# Patient Record
Sex: Female | Born: 1955
Health system: Southern US, Community
[De-identification: ages and names within clinical notes are randomized; demographics above are authoritative.]

## PROBLEM LIST (undated history)

## (undated) DIAGNOSIS — Z9889 Other specified postprocedural states: Secondary | ICD-10-CM

## (undated) DIAGNOSIS — G4730 Sleep apnea, unspecified: Secondary | ICD-10-CM

## (undated) DIAGNOSIS — F419 Anxiety disorder, unspecified: Secondary | ICD-10-CM

## (undated) DIAGNOSIS — D649 Anemia, unspecified: Secondary | ICD-10-CM

## (undated) DIAGNOSIS — N815 Vaginal enterocele: Secondary | ICD-10-CM

## (undated) DIAGNOSIS — R21 Rash and other nonspecific skin eruption: Secondary | ICD-10-CM

## (undated) DIAGNOSIS — Z8742 Personal history of other diseases of the female genital tract: Secondary | ICD-10-CM

## (undated) DIAGNOSIS — I519 Heart disease, unspecified: Secondary | ICD-10-CM

## (undated) DIAGNOSIS — F32A Depression, unspecified: Secondary | ICD-10-CM

## (undated) DIAGNOSIS — F329 Major depressive disorder, single episode, unspecified: Secondary | ICD-10-CM

## (undated) DIAGNOSIS — E785 Hyperlipidemia, unspecified: Secondary | ICD-10-CM

## (undated) HISTORY — DX: Sleep apnea, unspecified: G47.30

## (undated) HISTORY — PX: CARPAL TUNNEL RELEASE: SHX101

## (undated) HISTORY — DX: Hyperlipidemia, unspecified: E78.5

## (undated) HISTORY — PX: CORONARY ANGIOPLASTY WITH STENT PLACEMENT: SHX49

## (undated) HISTORY — DX: Rash and other nonspecific skin eruption: R21

## (undated) HISTORY — DX: Heart disease, unspecified: I51.9

## (undated) HISTORY — DX: Vaginal enterocele: N81.5

## (undated) HISTORY — DX: Personal history of other diseases of the female genital tract: Z87.42

## (undated) HISTORY — DX: Major depressive disorder, single episode, unspecified: F32.9

## (undated) HISTORY — DX: Anemia, unspecified: D64.9

## (undated) HISTORY — DX: Anxiety disorder, unspecified: F41.9

## (undated) HISTORY — DX: Depression, unspecified: F32.A

---

## 2006-07-05 ENCOUNTER — Emergency Department: Payer: Self-pay | Admitting: Emergency Medicine

## 2010-04-24 ENCOUNTER — Ambulatory Visit: Payer: Self-pay | Admitting: Internal Medicine

## 2010-06-17 ENCOUNTER — Ambulatory Visit: Payer: Self-pay | Admitting: Internal Medicine

## 2011-09-10 ENCOUNTER — Ambulatory Visit: Payer: Self-pay | Admitting: Internal Medicine

## 2011-09-13 ENCOUNTER — Inpatient Hospital Stay: Payer: Self-pay | Admitting: Specialist

## 2013-05-29 HISTORY — PX: ANGIOPLASTY: SHX39

## 2013-05-30 ENCOUNTER — Ambulatory Visit: Payer: Self-pay | Admitting: Internal Medicine

## 2013-06-22 ENCOUNTER — Ambulatory Visit: Payer: Self-pay | Admitting: Vascular Surgery

## 2013-07-26 ENCOUNTER — Ambulatory Visit: Payer: Self-pay | Admitting: Vascular Surgery

## 2013-07-26 LAB — BASIC METABOLIC PANEL
BUN: 9 mg/dL (ref 7–18)
Creatinine: 0.83 mg/dL (ref 0.60–1.30)
EGFR (African American): >60
Glucose: 75 mg/dL (ref 65–99)
Osmolality: 273 (ref 275–301)

## 2013-07-26 LAB — CBC
MCH: 31 pg (ref 26.0–34.0)
MCHC: 34 g/dL (ref 32.0–36.0)
WBC: 6.3 10*3/uL (ref 3.6–11.0)

## 2013-08-03 ENCOUNTER — Inpatient Hospital Stay: Payer: Self-pay | Admitting: Vascular Surgery

## 2013-08-04 LAB — CBC WITH DIFFERENTIAL/PLATELET
Basophil #: 0.1 10*3/uL (ref 0.0–0.1)
Basophil %: 0.5 %
Eosinophil #: 0.1 10*3/uL (ref 0.0–0.7)
Eosinophil %: 0.5 %
HGB: 11.5 g/dL — ABNORMAL LOW (ref 12.0–16.0)
MCH: 31 pg (ref 26.0–34.0)
MCHC: 33.9 g/dL (ref 32.0–36.0)
MCV: 92 fL (ref 80–100)
Monocyte #: 0.9 x10 3/mm (ref 0.2–0.9)
Monocyte %: 7.6 %
Neutrophil #: 8.4 10*3/uL — ABNORMAL HIGH (ref 1.4–6.5)
WBC: 11.8 10*3/uL — ABNORMAL HIGH (ref 3.6–11.0)

## 2013-08-04 LAB — BASIC METABOLIC PANEL
Anion Gap: 5 — ABNORMAL LOW (ref 7–16)
Calcium, Total: 8.8 mg/dL (ref 8.5–10.1)
Chloride: 107 mmol/L (ref 98–107)
Creatinine: 0.64 mg/dL (ref 0.60–1.30)
EGFR (African American): >60
EGFR (Non-African Amer.): >60
Osmolality: 276 (ref 275–301)
Potassium: 3.9 mmol/L (ref 3.5–5.1)
Sodium: 139 mmol/L (ref 136–145)

## 2013-08-04 LAB — PROTIME-INR: Prothrombin Time: 12.8 secs (ref 11.5–14.7)

## 2013-08-04 LAB — APTT: Activated PTT: 28.7 secs (ref 23.6–35.9)

## 2013-08-06 LAB — PATHOLOGY REPORT

## 2013-10-19 ENCOUNTER — Ambulatory Visit: Payer: Self-pay | Admitting: Internal Medicine

## 2014-03-26 ENCOUNTER — Ambulatory Visit: Payer: Self-pay | Admitting: Obstetrics and Gynecology

## 2014-03-26 DIAGNOSIS — I739 Peripheral vascular disease, unspecified: Secondary | ICD-10-CM

## 2014-03-26 DIAGNOSIS — I2510 Atherosclerotic heart disease of native coronary artery without angina pectoris: Secondary | ICD-10-CM

## 2014-03-29 DIAGNOSIS — Z8742 Personal history of other diseases of the female genital tract: Secondary | ICD-10-CM

## 2014-03-29 HISTORY — DX: Personal history of other diseases of the female genital tract: Z87.42

## 2014-03-29 HISTORY — PX: VAGINAL HYSTERECTOMY: SUR661

## 2014-04-01 ENCOUNTER — Ambulatory Visit: Payer: Self-pay | Admitting: Obstetrics and Gynecology

## 2014-04-02 LAB — HEMOGLOBIN: HGB: 10.6 g/dL — ABNORMAL LOW (ref 12.0–16.0)

## 2014-04-02 LAB — PATHOLOGY REPORT

## 2014-06-04 ENCOUNTER — Emergency Department: Payer: Self-pay | Admitting: Emergency Medicine

## 2014-11-22 IMAGING — US US CAROTID DUPLEX BILAT
2 series · 13 of 24 positions shown · non-contrast
Comparison: none

REASON FOR EXAM: Lt Carotid Bruit
COMMENTS:

[Series 1: us carotid duplex bilat · 0.05mm/px · 10 of 64 slices shown (1 of 2)]
[im 1/64]
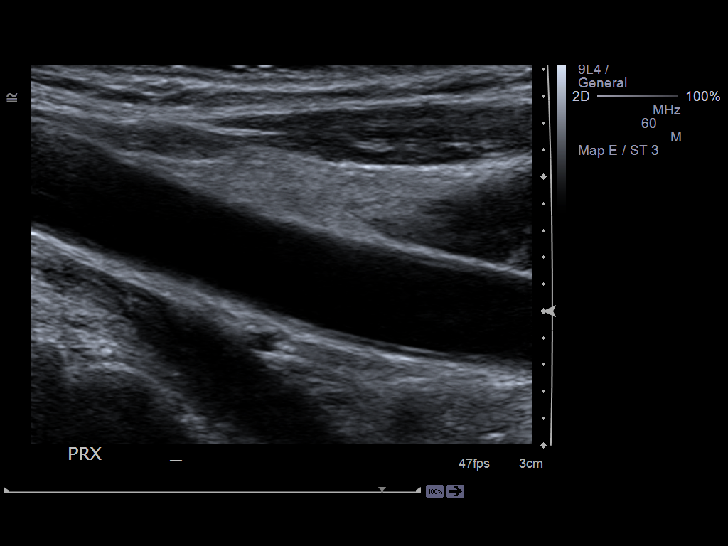
[im 8/64]
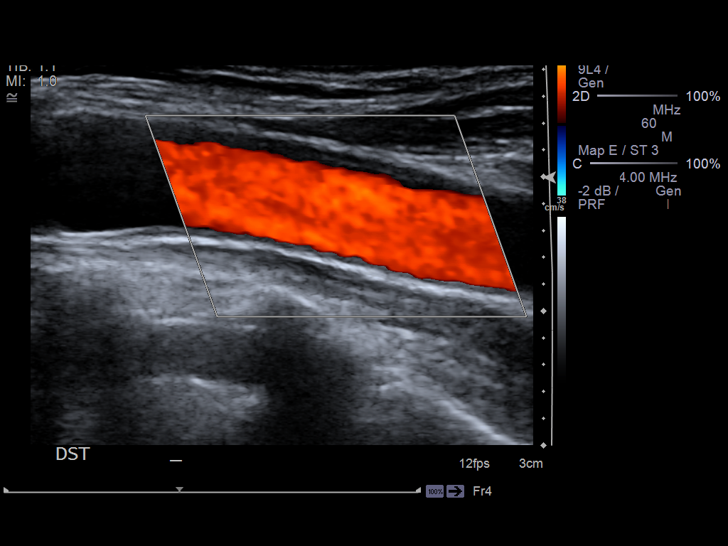
[im 15/64]
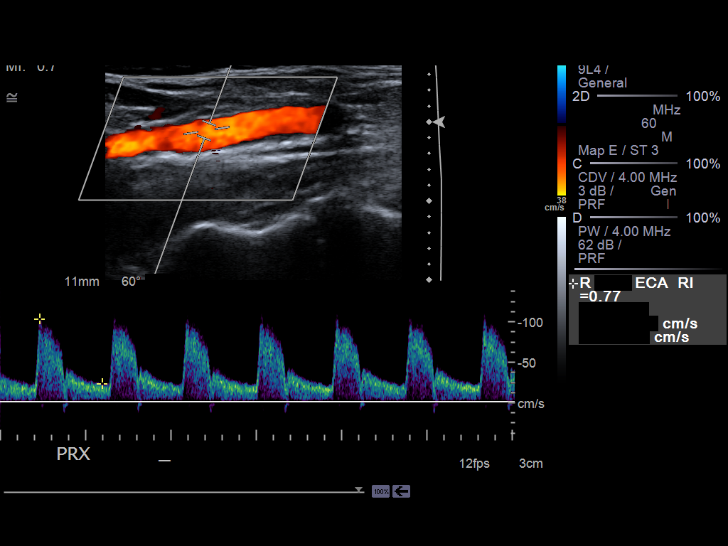
[im 22/64]
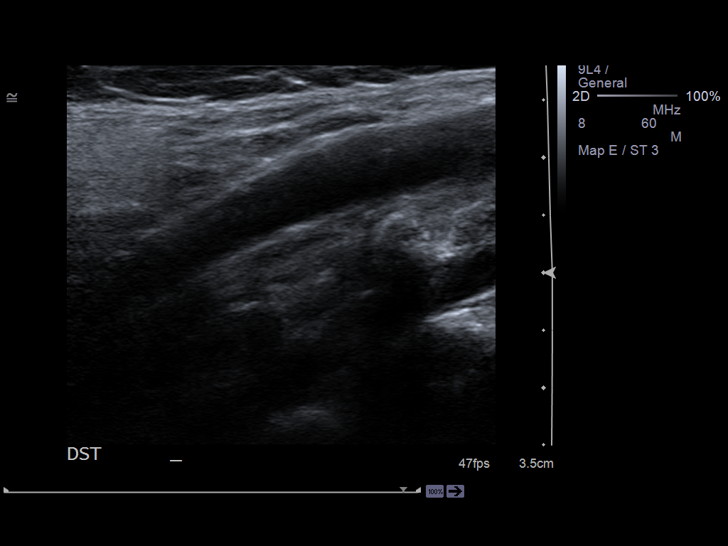
[im 29/64]
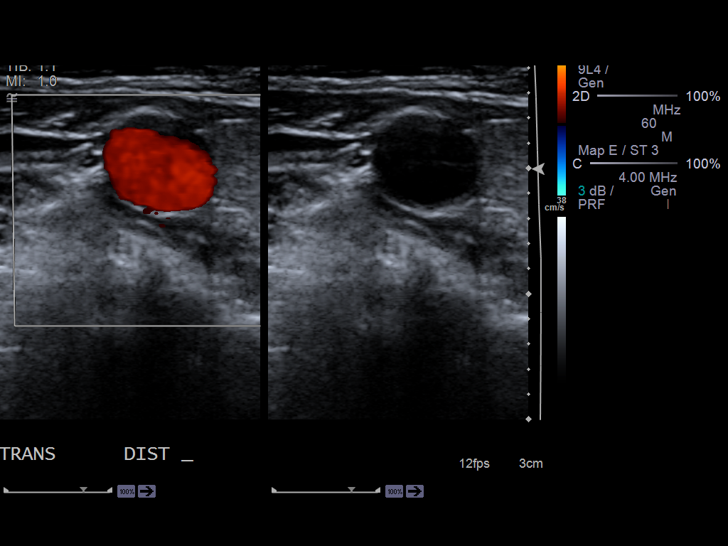
[im 36/64]
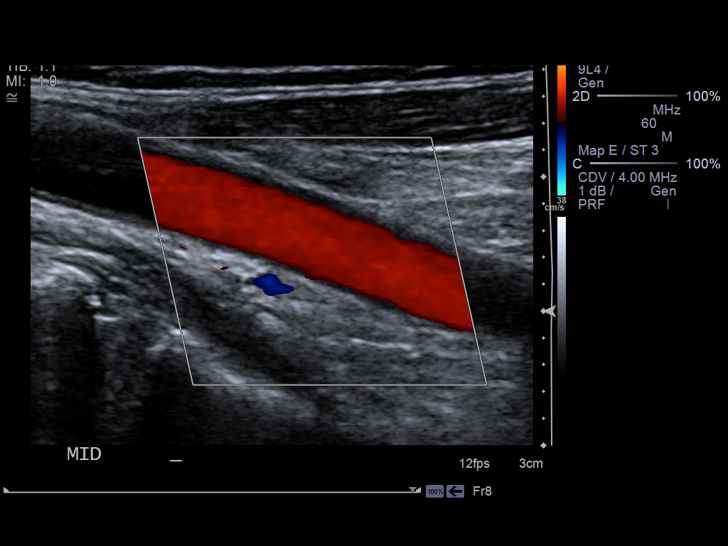
[im 43/64]
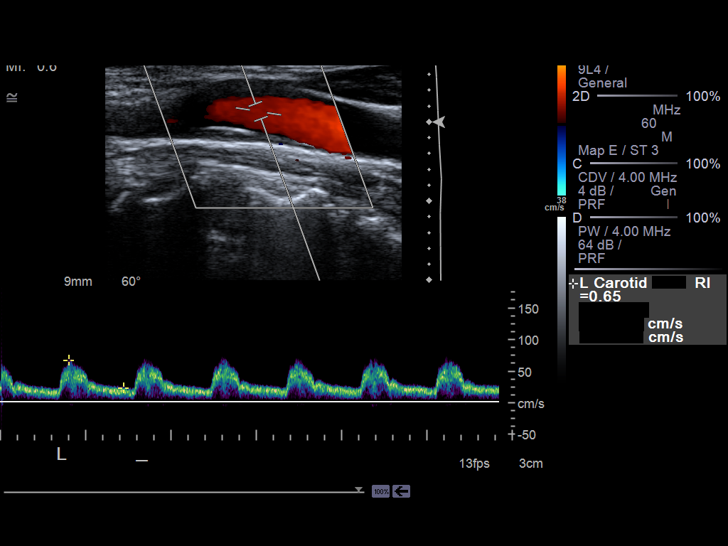
[im 46/64]
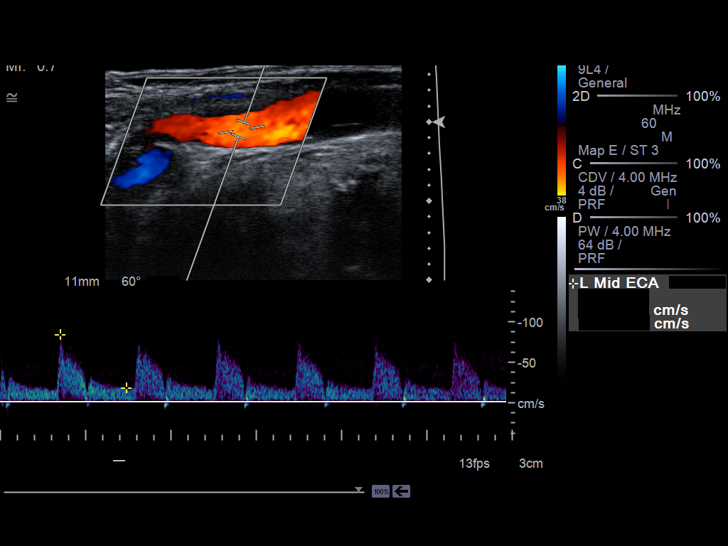
[im 53/64]
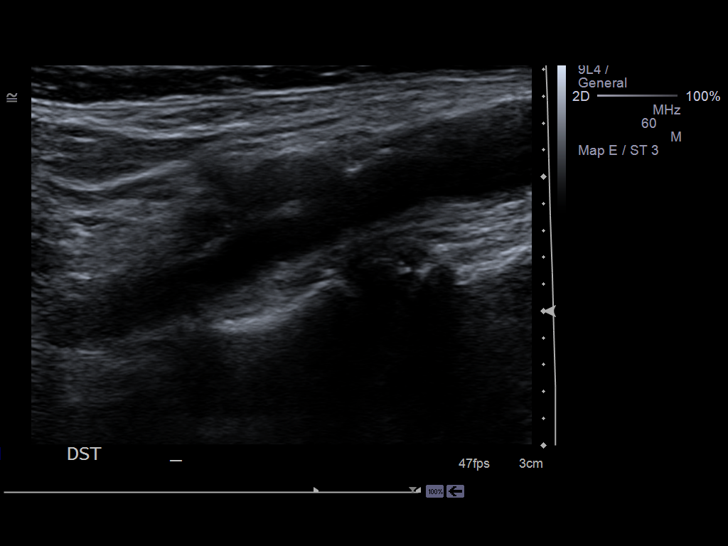
[im 60/64]
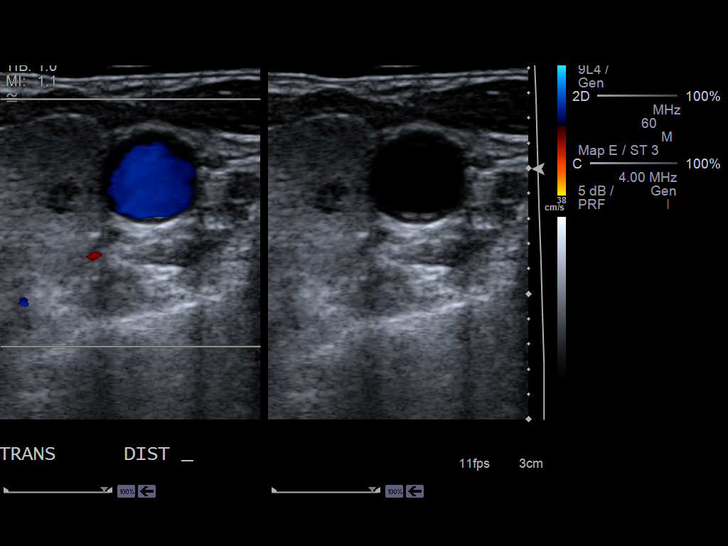

[Series 2001: us carotid duplex bilat · 0.05mm/px · 3 of 16 slices shown (2 of 2)]
[im 1/16]
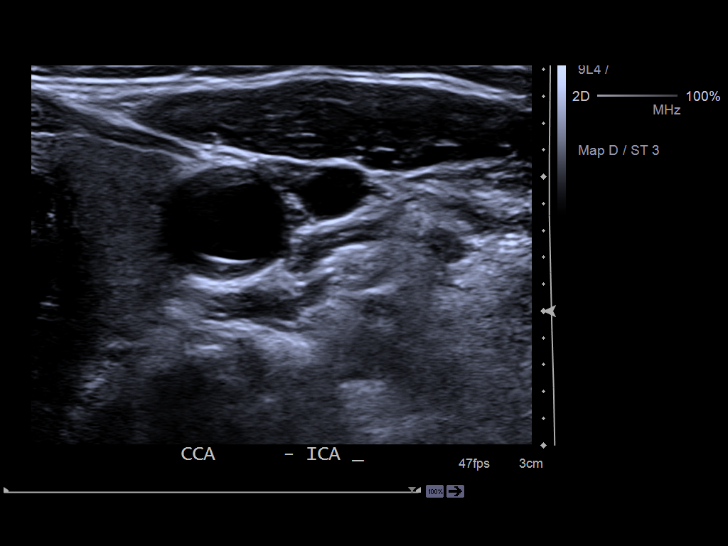
[im 8/16]
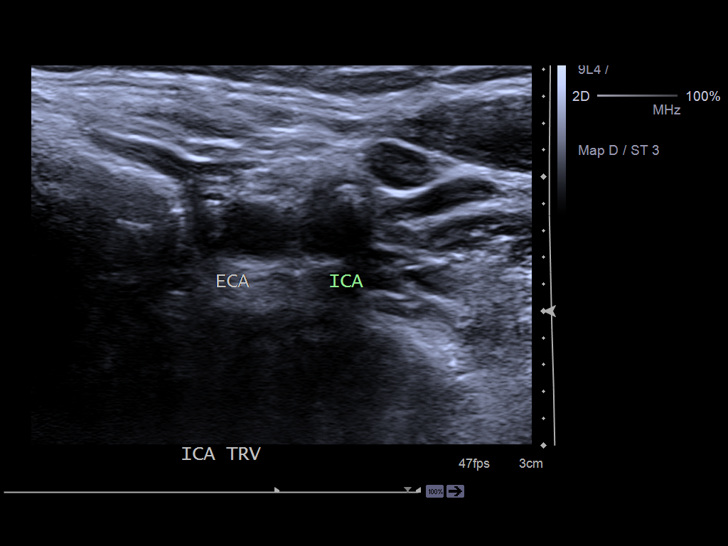
[im 16/16]
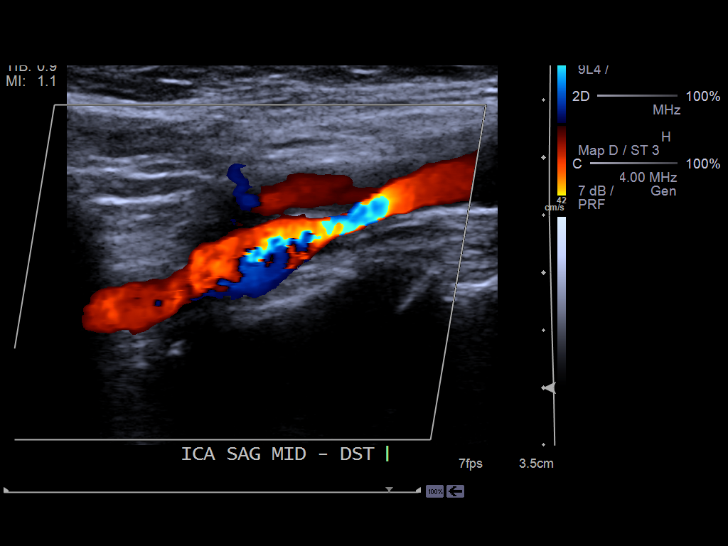

[13 of 24 positions shown; findings below may reference images not displayed]

PROCEDURE:     US  - US CAROTID DOPPLER BILATERAL  - May 30, 2013 [DATE]

RESULT:     Grayscale and color flow Doppler techniques were employed to
evaluate the cervical carotid systems.

On the right there is a small amount of calcified plaque at the carotid
bulb. The waveform patterns and color flow images appear normal. The peak
internal carotid systolic velocity measures 123 cm/sec and the peak common
carotid velocity measures 88 cm/sec corresponding to a normal ratio of 1.4.

On the left there is soft plaque at the level of the carotid bulb and
calcified plaque in the proximal ICA. The color flow images and waveform
patterns are consistent with a moderate degree of stenosis in the proximal
ICA. Turbulence is evident. The peak internal carotid systolic velocity on
the left is accelerated to 374 cm/sec the peak common carotid velocity
measures 81 cm/sec which corresponds to an abnormal ratio of 3.7.

The vertebral arteries are normal in flow direction bilaterally.
IMPRESSION: 1. A moderate amount of calcified plaque in the proximal ICA on the left
results in a degree of stenosis between 75 and 95%.
2. On the right there is no evidence of a hemodynamically significant
stenosis.

[REDACTED]

## 2015-03-21 NOTE — Discharge Summary (Signed)
PATIENT NAME:  Alyssa Watson, ISHEE MR#:  [id number] DATE OF BIRTH:  12-02-1955  DATE OF ADMISSION:  08/03/2013 DATE OF DISCHARGE:  08/05/2013  DIAGNOSIS: Critical stenosis of the left internal carotid artery.   PROCEDURE PERFORMED: Left carotid endarterectomy with primary closure, 08/03/2013.   HISTORY: Ms. Rumberger is a 59 year old woman, who was found to have a critical stenosis of the left internal carotid artery. After preoperative clearance, risks, benefits, and alternative therapies were discussed and the patient has elected to proceed with surgical treatment.   HOSPITAL COURSE: On the day of admission, she underwent successful left carotid endarterectomy. Postoperatively, she was noted to be mildly hypotensive with systolics in the 87N and was started on Neo-Synephrine drip. She was transferred to the unit. On postoperative day #1, she was still requiring small amount of Neo-Synephrine. She was also not tolerating a regular diet as she felt tired and nauseated. Over the next 24 hours, she improved significantly. She tolerated a full breakfast on postoperative day #2 and was weaned from her Neo-Synephrine easily over the past 24 hours. She was also noted to have mild reduction in her O2 sats on room air and this was easily treated with a single dose of Lasix and respiratory treatments. On postoperative day #2, she was felt fit for discharge.   She is discharged to home. She is to follow up with Dr. Hall Busing in several days for evaluation of her blood pressure and resuming her metoprolol. Her metoprolol will be stopped at discharge. She will follow up with my office in 7 to 10 days for routine postoperative observation. Diet is regular. Home medications are unchanged with the exception of the cessation of her beta blocker. Activities are light activities.  ____________________________ Katha Cabal, MD ggs:aw D: 08/10/2013 09:03:02 ET T: 08/10/2013 09:20:54 ET JOB#: [id number]  cc: Katha Cabal, MD, <Dictator> Leona Carry. Hall Busing, MD Katha Cabal MD ELECTRONICALLY SIGNED 08/30/2013 8:38

## 2015-03-21 NOTE — Discharge Summary (Signed)
PATIENT NAME:  Alyssa Watson, Alyssa Watson MR#:  [id number] DATE OF BIRTH:  September 29, 1956  DATE OF ADMISSION:  08/03/2013 DATE OF DISCHARGE:  08/05/2013  DISCHARGE DIAGNOSIS: Critical stenosis of the left internal carotid artery.   PROCEDURE PERFORMED:  Left carotid endarterectomy with primary closure, date August 03, 2013.   HISTORY: [name] is a 59 year old woman who presented with critical stenosis of the left internal carotid artery and has under gone successful preoperative cardiac evaluation. She is, therefore, undergoing carotid endarterectomy to prevent stroke. Risks and benefits have been reviewed. Alternative therapies have been discussed. The patient agrees to proceed with surgery.   HOSPITAL COURSE: On the day of surgery, the patient underwent successful carotid endarterectomy. Postoperatively she was noted to have some mild hypotension with systolics in the 15T and she was started on Neo-Synephrine drip. She was also continued on IV fluids. On postoperative day #1, she was weaning from her Neo-Synephrine drip. Her O2 saturations were slightly low, she was given an aerosol treatment as well as a single dose of Lasix. She responded quite well to this and on postoperative day #2, she was felt fit for discharge. She is discharged to home. She is in good condition she is neurologically intact and her neck appears to be healing quite nicely. She is not continued on her metoprolol at this time as her systolic blood pressures are approximately 110.   FOLLOWUP:  She will follow up with Dr. Hall Busing in 1 to 2 days. She will follow up with me in 1 week.   CONDITION ON DISCHARGE: Improved.   ____________________________ Katha Cabal, MD ggs:cs D: 08/05/2013 12:24:32 ET T: 08/05/2013 14:48:59 ET JOB#: [id number]  cc: Katha Cabal, MD, <Dictator> Leona Carry. Hall Busing, [place] MD ELECTRONICALLY SIGNED 08/10/2013 9:13

## 2015-03-21 NOTE — Op Note (Signed)
PATIENT NAME:  Alyssa Watson, Alyssa Watson MR#:  [id number] DATE OF BIRTH:  05-24-1956  DATE OF PROCEDURE:  08/03/2013  PREOPERATIVE DIAGNOSES:  Critical stenosis of the left internal carotid artery.   POSTOPERATIVE DIAGNOSIS:  Critical stenosis of the left internal carotid artery.   PROCEDURE PERFORMED:   1.  Left carotid endarterectomy with primary closure.  2.  Ultrasound-guided insertion of right radial arterial line.  SURGEON:  Hortencia Pilar, M.D.   FIRST ASSISTANT:  Ms. Gillie Manners.   ANESTHESIA:  General by endotracheal intubation.   FLUIDS:  Per anesthesia record.   ESTIMATED BLOOD LOSS:  75 mL.   SPECIMEN:  Carotid plaque to pathology for permanent section.   INDICATIONS:  Ms. Shannahan is a 59 year old woman who presents for treatment of her critical left carotid stenosis.  The risks and benefits were reviewed.  All questions answered.  The patient has elected to proceed with surgery.    DESCRIPTION OF PROCEDURE:  The patient is taken to the Operating Room and placed in the supine position.  After adequate general anesthesia was induced, multiple attempts by anesthesia starting an arterial line were unsuccessful and I was asked to secure arterial line for monitoring.  Ultrasound was placed in a sterile sleeve and the right radial artery was identified.  It was echolucent and pulsatile indicating patency.  A micropuncture needle was then used to access the artery without difficulty, microwire followed by a micro sheath.  Subsequently, an arrow 20-gauge angiocatheter was advanced over the wire without difficulty and the arterial line was secured and a sterile dressing was applied.   The patient was then positioned with her neck rotated to the right and extended slightly and the left neck was prepped and draped in a sterile fashion.   A linear incision is created along the anterior margin of the sternocleidomastoid muscle and carried down through the soft tissues.  External jugular vein was  ligated between 2-0 silk ties.  At this point, it was identified that there was a needle which appears to have been sterilized in the tray.  It was therefore, since these instruments had not yet been used in the case, elected to open a new tray to ensure that there was no breech in sterile technique.  The entire new tray was then opened after removing the other instruments and the operation proceeded.   The carotid was then identified at the level of the omohyoid and the dissection was carried in a craniad direction.  The external carotid and superior thyroid were looped with Silastic vessel loops.  Internal carotid artery was dissected to a level above visible plaque formation.   7000 units of heparin was given, allowed to circulate, the common followed by the external, followed by the internal carotid artery was clamped.  Arteriotomy was then made with an 11 blade and extended with Potts scissors.  Indwelling Sundt shunt was placed without difficulty and flow was re-established to the brain.    Endarterectomy was then performed under direct visualization for the common bulb and internal carotid artery.  External carotid artery was treated with eversion technique.  Area of endarterectomy was rather patulous and I elected not to proceed with patching, but rather with primary closure.  6-0 Prolene was then run from the apex in the internal carotid artery to the midportion of the arteriotomy.  Likewise a second 6-0 Prolene suture was utilized from the common carotid toward the center of the incision.  Flushing maneuvers were performed.  Copious irrigation with  heparinized saline was performed and subsequently the suture line was completed.  Flow was then re-established to the external carotid artery and then the internal carotid artery to prevent distal embolization.   The wound was then inspected for hemostasis.  Surgicel and Evicel were placed and the wound was closed using 3-0 Vicryl in a running fashion to  reapproximate the platysma and 4-0 Monocryl to reapproximate the skin.  Dermabond was applied.  The patient tolerated the procedure well.  There were no immediate complications.  She was awakened in the Operating Room, moving all extremities and obeying simple commands and taken to the recovery area in excellent condition.     ____________________________ Katha Cabal, MD ggs:ea D: 08/04/2013 16:04:00 ET T: 08/04/2013 18:59:46 ET JOB#: [id number]  cc: Katha Cabal, MD, <Dictator> Katha Cabal, MD Leona Carry Hall Busing, [place] MD ELECTRONICALLY SIGNED 08/10/2013 9:13

## 2015-03-22 NOTE — Op Note (Signed)
PATIENT NAME:  Alyssa Watson, Alyssa Watson MR#:  [id number] DATE OF BIRTH:  [birth year]  DATE OF PROCEDURE:  04/01/2014  PREOPERATIVE DIAGNOSIS: Uterine prolapse of 3rd degree.   POSTOPERATIVE DIAGNOSIS: Uterine prolapse of 3rd degree.    OPERATION: Total vaginal hysterectomy with bilateral salpingo-oophorectomy.   ANESTHESIA: General.   SURGEON: Rubie Maid, MD   ASSISTANT: Malachi Paradise, MD and Lucile Shutters, PA student.   ESTIMATED BLOOD LOSS: 25 mL.  OPERATIVE FLUIDS: 1000 mL.   URINE OUTPUT: 100 mL.   COMPLICATIONS: None.   FINDINGS: Second-degree uterine prolapse, which advanced to 3rd degree on bimanual exam, a small but normal-appearing cervix, uterus, bilateral tubes, and ovaries.   SPECIMEN: Included the uterus with cervix, bilateral fallopian tubes, and ovaries.   PROCEDURE: The patient was taken to the operating room where she was placed under general anesthesia without difficulty. She was then prepped and draped in normal sterile fashion. She was placed in the dorsal lithotomy position using Allen stirrups. A Foley catheter was placed, allowed to drain the bladder, and then clamped off. Next, a weighted speculum was placed in the vagina and the cervix was grasped using a double-tooth tenaculum. The cervix was then circumferentially injected with approximately 20 mL of injectable saline. The cervix was then circumferentially incised with the scalpel. The posterior cul-de-sac was then entered sharply with the Mayo scissors without difficulty. The same procedure was performed anteriorly and the bladder was dissected off the pubovesical cervical fascia.  At this point, a Heaney clamp was placed over the uterosacral ligaments on either side. These were then transected and suture ligated with 0 Vicryl. Hemostasis was assured. The cardinal ligaments were then clamped on both sides, transected, and suture ligated in similar fashion. Uterine arteries and broad ligament were then serially  clamped with Heaney clamps, transected, and suture ligated on both sides. Excellent hemostasis was visualized. Both cornua were then clamped with Heaney clamps, transected, and the uterus was delivered. These pedicles were then ligated first with a free tie and then using a suture of 0 Vicryl. Excellent hemostasis was noted. Next, the fallopian tube and ovary on the left were then grasped using a Babcock clamp and the infundibulopelvic ligament was then clamped with a Heaney clamp, transected, and then suture ligated. The same procedure was performed on the right. After this, both infundibulopelvic ligament pedicles were then evaluated for bleeding and good hemostasis was noted at both pedicles. Next, the vaginal cuff angles were closed using a  suture of 0 Vicryl on both sides and transfixed to the ipsilateral uterosacral ligament. The remainder of the vaginal cuff was closed using interrupted sutures of 0 Vicryl. All instruments were then removed from the vagina. The clamp was removed from the Foley catheter and the bladder was then again allowed to drain. Next, the patient was taken out of dorsolithotomy position, awakened from general anesthesia. She was then taken to the PACU in stable condition. Sponge, lap, needle and instrument counts were correct x 2 at the end of the procedure.    ____________________________ Chesley Noon. Marcelline Mates, MD asc:dd D: 04/01/2014 14:38:28 ET T: 04/02/2014 00:36:27 ET JOB#: [id number]  cc: Chesley Noon. Marcelline Mates, MD, <Dictator> Augusto Gamble MD ELECTRONICALLY SIGNED 04/03/2014 17:08

## 2015-05-13 ENCOUNTER — Encounter: Payer: Self-pay | Admitting: Obstetrics and Gynecology

## 2015-07-22 ENCOUNTER — Encounter: Payer: Self-pay | Admitting: Obstetrics and Gynecology

## 2015-07-22 ENCOUNTER — Ambulatory Visit (INDEPENDENT_AMBULATORY_CARE_PROVIDER_SITE_OTHER): Payer: BLUE CROSS/BLUE SHIELD | Admitting: Obstetrics and Gynecology

## 2015-07-22 VITALS — BP 153/86 | HR 78 | Temp 98.3°F | Resp 16 | Ht 61.0 in | Wt 122.5 lb

## 2015-07-22 DIAGNOSIS — Z01419 Encounter for gynecological examination (general) (routine) without abnormal findings: Secondary | ICD-10-CM

## 2015-07-22 DIAGNOSIS — F419 Anxiety disorder, unspecified: Secondary | ICD-10-CM

## 2015-07-22 DIAGNOSIS — I6529 Occlusion and stenosis of unspecified carotid artery: Secondary | ICD-10-CM | POA: Insufficient documentation

## 2015-07-22 DIAGNOSIS — F411 Generalized anxiety disorder: Secondary | ICD-10-CM | POA: Insufficient documentation

## 2015-07-22 DIAGNOSIS — Z720 Tobacco use: Secondary | ICD-10-CM | POA: Insufficient documentation

## 2015-07-22 DIAGNOSIS — Z8639 Personal history of other endocrine, nutritional and metabolic disease: Secondary | ICD-10-CM | POA: Insufficient documentation

## 2015-07-22 DIAGNOSIS — F329 Major depressive disorder, single episode, unspecified: Secondary | ICD-10-CM | POA: Insufficient documentation

## 2015-07-22 DIAGNOSIS — I519 Heart disease, unspecified: Secondary | ICD-10-CM

## 2015-07-22 DIAGNOSIS — Z87898 Personal history of other specified conditions: Secondary | ICD-10-CM | POA: Insufficient documentation

## 2015-07-22 DIAGNOSIS — K469 Unspecified abdominal hernia without obstruction or gangrene: Secondary | ICD-10-CM | POA: Insufficient documentation

## 2015-07-22 NOTE — Addendum Note (Signed)
Addended by: Augusto Gamble on: 07/22/2015 09:40 AM   Modules accepted: Miquel Dunn

## 2015-07-22 NOTE — Progress Notes (Addendum)
Subjective:    Alyssa Watson is a 59 y.o. G34P3003 female who presents for annual exam. The patient has no complaints today. The patient is sexually active. GYN screening history: last pap: approximate date 2014 and was normal and last mammogram: approximate date 05/2014 and was normal. Has never had a colonoscopy. The patient is not taking hormone replacement therapy. Patient denies post-menopausal vaginal bleeding. The patient wears seatbelts: yes. The patient participates in regular exercise: no. Has the patient ever been transfused or tattooed?: no. The patient reports that there is not domestic violence in her life.   Menstrual History: OB History    Gravida Para Term Preterm AB TAB SAB Ectopic Multiple Living   3 3 3       3       Menarche age: 6  No LMP recorded.  Patient has had a hysterectomy.   Denie h/o STIs or abnormal pap smears.     Past Medical History  Diagnosis Date  . Sleep apnea   . Heart disease     h/o angioplasty  . Vaginal enterocele     1st degree cystocele  . Rash of face   . Hyperlipemia   . Anxiety and depression     Past Surgical History  Procedure Laterality Date  . Vaginal hysterectomy  03/2014    with BSO  . Angioplasty  05/2013    stent placed  . Carpal tunnel release Left     No family history on file.  Social History   Social History  . Marital Status: Unknown    Spouse Name: N/A  . Number of Children: N/A  . Years of Education: N/A   Occupational History  . Not on file.   Social History Main Topics  . Smoking status: Current Every Day Smoker  . Smokeless tobacco: Not on file  . Alcohol Use: No  . Drug Use: No  . Sexual Activity: Not on file   Other Topics Concern  . Not on file   Social History Narrative    Current Outpatient Prescriptions on File Prior to Visit  Medication Sig Dispense Refill  . ALPRAZolam (XANAX) 0.5 MG tablet Take 0.5 mg by mouth at bedtime as needed for anxiety.    Marland Kitchen aspirin EC 81 MG tablet Take 81  mg by mouth daily.    Marland Kitchen atorvastatin (LIPITOR) 80 MG tablet Take 80 mg by mouth daily.    . butalbital-acetaminophen-caffeine (FIORICET, ESGIC) 50-325-40 MG per tablet Take 1 tablet by mouth 2 (two) times daily as needed for headache.    . clopidogrel (PLAVIX) 75 MG tablet Take 75 mg by mouth daily.    . metoprolol tartrate (LOPRESSOR) 25 MG tablet Take 25 mg by mouth daily.    . sertraline (ZOLOFT) 25 MG tablet Take 25 mg by mouth daily.     No current facility-administered medications on file prior to visit.    Allergies  Allergen Reactions  . Codeine   . Penicillins     Review of Systems Constitutional: negative for chills, fatigue, fevers and sweats Eyes: negative for irritation, redness and visual disturbance Ears, nose, mouth, throat, and face: negative for hearing loss, nasal congestion, snoring and tinnitus Respiratory: negative for asthma, cough, sputum Cardiovascular: negative for chest pain, dyspnea, exertional chest pressure/discomfort, irregular heart beat, palpitations and syncope Gastrointestinal: negative for abdominal pain, change in bowel habits, nausea and vomiting Genitourinary: negative for abnormal menstrual periods, genital lesions, sexual problems and vaginal discharge, dysuria and urinary incontinence Integument/breast: negative  for breast lump, breast tenderness and nipple discharge Hematologic/lymphatic: negative for bleeding and easy bruising Musculoskeletal: positive for low back pain; negative for muscle weakness Neurological: negative for dizziness, headaches, vertigo and weakness Endocrine: negative for diabetic symptoms including polydipsia, polyuria and skin dryness Allergic/Immunologic: negative for hay fever and urticaria    Objective:  Blood pressure 153/86, pulse 78, temperature 98.3 F (36.8 C), temperature source Oral, resp. rate 16, height 5\' 1"  (1.549 m), weight 122 lb 8 oz (55.566 kg).  General Appearance:    Alert, cooperative, no  distress, appears stated age  Head:    Normocephalic, without obvious abnormality, atraumatic  Eyes:    PERRL, conjunctiva/corneas clear, EOM's intact, both eyes  Ears:    Normal external ear canals, both ears  Nose:   Nares normal, septum midline, mucosa normal, no drainage or sinus tenderness  Throat:   Lips, mucosa, and tongue normal; teeth and gums normal  Neck:   Supple, symmetrical, trachea midline, no adenopathy; thyroid: no enlargement/tenderness/nodules; no carotid bruit or JVD  Back:     Symmetric, no curvature, ROM normal, no CVA tenderness  Lungs:     Clear to auscultation bilaterally, respirations unlabored  Chest Wall:    No tenderness or deformity   Heart:    Regular rate and rhythm, S1 and S2 normal, no murmur, rub or gallop  Breast Exam:    No tenderness, masses, or nipple abnormality  Abdomen:     Soft, non-tender, bowel sounds active all four quadrants, no masses, no organomegaly.  Genitalia:    Pelvic:external genitalia normal, vagina with lesions, discharge, or tenderness, rectovaginal septum  normal. Uterus, cervix, and adnexae surgically absent.   Rectal:    Normal external sphincter.  No hemorrhoids appreciated. Internal exam not done.   Extremities:   Extremities normal, atraumatic, no cyanosis or edema  Pulses:   2+ and symmetric all extremities  Skin:   Skin color, texture, turgor normal, no rashes or lesions  Lymph nodes:   Cervical, supraclavicular, and axillary nodes normal  Neurologic:   CNII-XII intact, normal strength, sensation and reflexes throughout     Assessment:   Normal gyn exam Menopause  Tobacco abuse Plan:    All questions answered. Breast self exam technique reviewed and patient encouraged to perform self-exam monthly. Discussed healthy lifestyle modifications. Mammogram. Colonoscopy ordered   Advised on Calcium and Vit D supplementation for bone health.  Notes annual labs are usually drawn by PCP.  Has appointment in November.   Counseled on smoking cessation.  Advised on OTC pain relievers, hot/cold compresses to the back for muscle relief, discussed bending techniques (as patient does a lot of heavy lifting for job).  RTC in 1 year for annual exam.   Rubie Maid, MD [facility]

## 2016-07-22 ENCOUNTER — Encounter: Payer: BLUE CROSS/BLUE SHIELD | Admitting: Obstetrics and Gynecology

## 2017-08-11 ENCOUNTER — Other Ambulatory Visit: Payer: Self-pay | Admitting: Internal Medicine

## 2017-08-11 DIAGNOSIS — Z1231 Encounter for screening mammogram for malignant neoplasm of breast: Secondary | ICD-10-CM

## 2018-05-24 ENCOUNTER — Other Ambulatory Visit: Payer: Self-pay

## 2018-05-24 ENCOUNTER — Emergency Department
Admission: EM | Admit: 2018-05-24 | Discharge: 2018-05-24 | Disposition: A | Discharge status: Home / Self Care | Payer: BLUE CROSS/BLUE SHIELD | Attending: Emergency Medicine | Admitting: Emergency Medicine

## 2018-05-24 DIAGNOSIS — Z79899 Other long term (current) drug therapy: Secondary | ICD-10-CM | POA: Diagnosis not present

## 2018-05-24 DIAGNOSIS — Z7982 Long term (current) use of aspirin: Secondary | ICD-10-CM | POA: Diagnosis not present

## 2018-05-24 DIAGNOSIS — Z7902 Long term (current) use of antithrombotics/antiplatelets: Secondary | ICD-10-CM | POA: Insufficient documentation

## 2018-05-24 DIAGNOSIS — R55 Syncope and collapse: Secondary | ICD-10-CM | POA: Insufficient documentation

## 2018-05-24 LAB — BASIC METABOLIC PANEL
ANION GAP: 7 (ref 5–15)
BUN: 10 mg/dL (ref 8–23)
CO2: 23 mmol/L (ref 22–32)
Calcium: 8.4 mg/dL — ABNORMAL LOW (ref 8.9–10.3)
Chloride: 108 mmol/L (ref 98–111)
Creatinine, Ser: 0.58 mg/dL (ref 0.44–1.00)
GFR calc Af Amer: >60 mL/min (ref >60)
GLUCOSE: 96 mg/dL (ref 70–99)
Potassium: 3 mmol/L — ABNORMAL LOW (ref 3.5–5.1)
Sodium: 138 mmol/L (ref 135–145)

## 2018-05-24 LAB — TROPONIN I
Troponin I: <0.03 ng/mL (ref <0.03)
Troponin I: <0.03 ng/mL (ref <0.03)

## 2018-05-24 LAB — CBC WITH DIFFERENTIAL/PLATELET
Basophils Absolute: 0 10*3/uL (ref 0–0.1)
Basophils Relative: 1 %
EOS ABS: 0.1 10*3/uL (ref 0–0.7)
Eosinophils Relative: 1 %
HEMATOCRIT: 35.9 % (ref 35.0–47.0)
HEMOGLOBIN: 12.5 g/dL (ref 12.0–16.0)
LYMPHS ABS: 1.3 10*3/uL (ref 1.0–3.6)
Lymphocytes Relative: 18 %
MCH: 31.6 pg (ref 26.0–34.0)
MCHC: 34.9 g/dL (ref 32.0–36.0)
MCV: 90.6 fL (ref 80.0–100.0)
MONO ABS: 0.4 10*3/uL (ref 0.2–0.9)
MONOS PCT: 5 %
NEUTROS PCT: 75 %
Neutro Abs: 5.5 10*3/uL (ref 1.4–6.5)
PLATELETS: 164 10*3/uL (ref 150–440)
RBC: 3.96 MIL/uL (ref 3.80–5.20)
RDW: 13.9 % (ref 11.5–14.5)
WBC: 7.4 10*3/uL (ref 3.6–11.0)

## 2018-05-24 LAB — URINALYSIS, COMPLETE (UACMP) WITH MICROSCOPIC
BACTERIA UA: NONE SEEN
Bilirubin Urine: NEGATIVE
Glucose, UA: NEGATIVE mg/dL
Ketones, ur: NEGATIVE mg/dL
Leukocytes, UA: NEGATIVE
NITRITE: NEGATIVE
PH: 7 (ref 5.0–8.0)
PROTEIN: NEGATIVE mg/dL
Specific Gravity, Urine: 1.003 — ABNORMAL LOW (ref 1.005–1.030)
Squamous Epithelial / LPF: NONE SEEN (ref 0–5)

## 2018-05-24 MED ORDER — POTASSIUM CHLORIDE CRYS ER 20 MEQ PO TBCR
40.0000 meq | EXTENDED_RELEASE_TABLET | Freq: Once | ORAL | Status: AC
  Administered 2018-05-24: 40 meq via ORAL
  Filled 2018-05-24: qty 2

## 2018-05-24 NOTE — Discharge Instructions (Addendum)
Please seek medical attention for any high fevers, chest pain, shortness of breath, change in behavior, persistent vomiting, bloody stool or any other new or concerning symptoms.  

## 2018-05-24 NOTE — ED Provider Notes (Signed)
[facility] Emergency Department Provider Note  ____________________________________________   I have reviewed the triage vital signs and the nursing notes.   HISTORY  Chief Complaint Near Syncope   History limited by: Not Limited   HPI Alyssa Watson is a 62 y.o. female who presents to the emergency department today via EMS because of concerns for near syncopal episode.  Patient states that she had a normal day today.  She states at baseline she does not eat a whole lot throughout the day but ate her normal amount.  Then when she was playing a video game she started feeling hot and flushed.  She then felt like her vision was changing and she was going to pass out.  She denies any chest pain or shortness of breath associated with this.  It did start resolving on its own and she started feeling better once EMS arrived.  She thinks the whole episode lasted maybe 15 minutes.  She denies similar symptoms in the past.  Per medical record review patient has a history of stent placement.  Past Medical History:  Diagnosis Date  . Anxiety and depression   . H/O uterine prolapse 03/2014  . Heart disease    h/o angioplasty  . Hyperlipemia   . Rash of face   . Sleep apnea   . Vaginal enterocele    1st degree cystocele    Patient Active Problem List   Diagnosis Date Noted  . Anxiety and depression 07/22/2015  . Cardiac disease 07/22/2015  . H/O elevated lipids 07/22/2015  . H/O disease 07/22/2015  . Enterocele 07/22/2015  . Tobacco abuse 07/22/2015    Past Surgical History:  Procedure Laterality Date  . ANGIOPLASTY  05/2013   stent placed  . CARPAL TUNNEL RELEASE Left   . VAGINAL HYSTERECTOMY  03/2014   with BSO    Prior to Admission medications   Medication Sig Start Date End Date Taking? Authorizing Provider  ALPRAZolam Duanne Moron) 0.5 MG tablet Take 0.5 mg by mouth at bedtime as needed for anxiety.    [provider]  aspirin EC 81 MG tablet  Take 81 mg by mouth daily.    [provider]  atorvastatin (LIPITOR) 80 MG tablet Take 80 mg by mouth daily.    [provider]  butalbital-acetaminophen-caffeine (FIORICET, ESGIC) 50-325-40 MG per tablet Take 1 tablet by mouth 2 (two) times daily as needed for headache.    [provider]  clopidogrel (PLAVIX) 75 MG tablet Take 75 mg by mouth daily.    [provider]  metoprolol tartrate (LOPRESSOR) 25 MG tablet Take 25 mg by mouth daily.    [provider]  sertraline (ZOLOFT) 25 MG tablet Take 25 mg by mouth daily.    [provider]    Allergies Codeine and Penicillins  History reviewed. No pertinent family history.  Social History Social History   Tobacco Use  . Smoking status: Current Every Day Smoker  . Smokeless tobacco: Current User  Substance Use Topics  . Alcohol use: No    Alcohol/week: 0.0 oz  . Drug use: No    Review of Systems Constitutional: No fever/chills Eyes: Positive for blurry vision ENT: No sore throat. Cardiovascular: Denies chest pain. Respiratory: Denies shortness of breath. Gastrointestinal: No abdominal pain.  No nausea, no vomiting.  No diarrhea.   Genitourinary: Negative for dysuria. Musculoskeletal: Negative for back pain. Skin: Negative for rash. Neurological: Negative for headaches, focal weakness or numbness.  ____________________________________________  PHYSICAL EXAM:  VITAL SIGNS: ED Triage Vitals  Enc Vitals Group     BP 05/24/18 1935 131/65     Pulse Rate 05/24/18 1934 77     Resp 05/24/18 1934 16     Temp 05/24/18 1934 (!) 97.5 F (36.4 C)     Temp Source 05/24/18 1934 Oral     SpO2 05/24/18 1934 98 %     Weight 05/24/18 1936 122 lb (55.3 kg)     Height 05/24/18 1936 5\' 1"  (1.549 m)     Head Circumference --      Peak Flow --      Pain Score 05/24/18 1936 0     Pain Loc --     Constitutional: Alert and oriented.  Eyes: Conjunctivae are normal.  ENT      Head:  Normocephalic and atraumatic.      Nose: No congestion/rhinnorhea.      Mouth/Throat: Mucous membranes are moist.      Neck: No stridor. Hematological/Lymphatic/Immunilogical: No cervical lymphadenopathy. Cardiovascular: Normal rate, regular rhythm.  No murmurs, rubs, or gallops.  Respiratory: Normal respiratory effort without tachypnea nor retractions. Breath sounds are clear and equal bilaterally. No wheezes/rales/rhonchi. Gastrointestinal: Soft and non tender. No rebound. No guarding.  Genitourinary: Deferred Musculoskeletal: Normal range of motion in all extremities. No lower extremity edema. Neurologic:  Normal speech and language. No gross focal neurologic deficits are appreciated.  Skin:  Skin is warm, dry and intact. No rash noted. Psychiatric: Mood and affect are normal. Speech and behavior are normal. Patient exhibits appropriate insight and judgment.  ____________________________________________    LABS (pertinent positives/negatives)  Trop <0.03 x2 CBC wnl BMP na 138, k 3.0, glu 96, cr 0.58 UA not consistent with infection  ____________________________________________   EKG  I, Nance Pear, attending physician, personally viewed and interpreted this EKG  EKG Time: 1936 Rate: 75 Rhythm: sinus rhythm Axis: normal Intervals: qtc 463 QRS: narrow ST changes: no st elevation Impression: normal ekg   ____________________________________________    RADIOLOGY  None  ____________________________________________   PROCEDURES  Procedures  ____________________________________________   INITIAL IMPRESSION / ASSESSMENT AND PLAN / ED COURSE  Pertinent labs & imaging results that were available during my care of the patient were reviewed by me and considered in my medical decision making (see chart for details).   Patient presented to the emergency department today after a near syncopal episode.  Differential would be broad including vasovagal, cardiac  etiology, infection, anemia, electrolyte abnormality arrhythmia amongst other etiologies.  Patient's work-up showed a slightly low potassium.  All this was a low there is no significant cardiac changes noted on EKG.  Patient was given potassium while she was here.  2 sets of troponin were obtained both negative.  Patient did not have any further symptoms.  At this point do wonder if vasovagal likely.  Will have patient follow-up with primary care.  Discussed findings plan with patient.  ____________________________________________   FINAL CLINICAL IMPRESSION(S) / ED DIAGNOSES  Final diagnoses:  Near syncope     Note: This dictation was prepared with Dragon dictation. Any transcriptional errors that result from this process are unintentional     Nance Pear, MD 05/24/18 [phone]

## 2018-05-24 NOTE — ED Triage Notes (Signed)
Pt arrived via ems due to near sycopal episode at home. No LOC, pt NAD. Respirations even and non labored with no pain. Hx of cardiac stent. BP with ems was in the 60's but came up to 140/70. A&Ox4.

## 2019-02-21 DIAGNOSIS — I2510 Atherosclerotic heart disease of native coronary artery without angina pectoris: Secondary | ICD-10-CM | POA: Insufficient documentation

## 2019-03-08 ENCOUNTER — Emergency Department: Payer: BLUE CROSS/BLUE SHIELD

## 2019-03-08 ENCOUNTER — Observation Stay
Admission: EM | Admit: 2019-03-08 | Discharge: 2019-03-10 | Disposition: A | Discharge status: Home / Self Care | Payer: BLUE CROSS/BLUE SHIELD | Attending: Internal Medicine | Admitting: Internal Medicine

## 2019-03-08 ENCOUNTER — Encounter: Payer: Self-pay | Admitting: *Deleted

## 2019-03-08 ENCOUNTER — Other Ambulatory Visit: Payer: Self-pay

## 2019-03-08 DIAGNOSIS — I959 Hypotension, unspecified: Secondary | ICD-10-CM | POA: Insufficient documentation

## 2019-03-08 DIAGNOSIS — E785 Hyperlipidemia, unspecified: Secondary | ICD-10-CM | POA: Insufficient documentation

## 2019-03-08 DIAGNOSIS — Z880 Allergy status to penicillin: Secondary | ICD-10-CM | POA: Diagnosis not present

## 2019-03-08 DIAGNOSIS — I10 Essential (primary) hypertension: Secondary | ICD-10-CM | POA: Insufficient documentation

## 2019-03-08 DIAGNOSIS — Z716 Tobacco abuse counseling: Secondary | ICD-10-CM | POA: Insufficient documentation

## 2019-03-08 DIAGNOSIS — Z7902 Long term (current) use of antithrombotics/antiplatelets: Secondary | ICD-10-CM | POA: Insufficient documentation

## 2019-03-08 DIAGNOSIS — F329 Major depressive disorder, single episode, unspecified: Secondary | ICD-10-CM | POA: Diagnosis not present

## 2019-03-08 DIAGNOSIS — Z79899 Other long term (current) drug therapy: Secondary | ICD-10-CM | POA: Diagnosis not present

## 2019-03-08 DIAGNOSIS — G4730 Sleep apnea, unspecified: Secondary | ICD-10-CM | POA: Insufficient documentation

## 2019-03-08 DIAGNOSIS — F419 Anxiety disorder, unspecified: Secondary | ICD-10-CM | POA: Insufficient documentation

## 2019-03-08 DIAGNOSIS — R55 Syncope and collapse: Principal | ICD-10-CM | POA: Diagnosis present

## 2019-03-08 DIAGNOSIS — F17200 Nicotine dependence, unspecified, uncomplicated: Secondary | ICD-10-CM | POA: Diagnosis not present

## 2019-03-08 DIAGNOSIS — Z7901 Long term (current) use of anticoagulants: Secondary | ICD-10-CM | POA: Diagnosis not present

## 2019-03-08 DIAGNOSIS — Z955 Presence of coronary angioplasty implant and graft: Secondary | ICD-10-CM | POA: Diagnosis not present

## 2019-03-08 DIAGNOSIS — I2510 Atherosclerotic heart disease of native coronary artery without angina pectoris: Secondary | ICD-10-CM | POA: Insufficient documentation

## 2019-03-08 DIAGNOSIS — Z885 Allergy status to narcotic agent status: Secondary | ICD-10-CM | POA: Diagnosis not present

## 2019-03-08 DIAGNOSIS — Z7982 Long term (current) use of aspirin: Secondary | ICD-10-CM | POA: Insufficient documentation

## 2019-03-08 LAB — BASIC METABOLIC PANEL
Anion gap: 11 (ref 5–15)
BUN: 11 mg/dL (ref 8–23)
CO2: 25 mmol/L (ref 22–32)
Calcium: 9.3 mg/dL (ref 8.9–10.3)
Chloride: 103 mmol/L (ref 98–111)
Creatinine, Ser: 0.65 mg/dL (ref 0.44–1.00)
GFR calc Af Amer: >60 mL/min (ref >60)
GFR calc non Af Amer: >60 mL/min (ref >60)
Glucose, Bld: 107 mg/dL — ABNORMAL HIGH (ref 70–99)
Potassium: 4.2 mmol/L (ref 3.5–5.1)
Sodium: 139 mmol/L (ref 135–145)

## 2019-03-08 LAB — TROPONIN I
Troponin I: <0.03 ng/mL (ref <0.03)
Troponin I: <0.03 ng/mL (ref <0.03)

## 2019-03-08 LAB — CBC
HCT: 39.5 % (ref 36.0–46.0)
Hemoglobin: 12.8 g/dL (ref 12.0–15.0)
MCH: 30.5 pg (ref 26.0–34.0)
MCHC: 32.4 g/dL (ref 30.0–36.0)
MCV: 94 fL (ref 80.0–100.0)
Platelets: 152 10*3/uL (ref 150–400)
RBC: 4.2 MIL/uL (ref 3.87–5.11)
RDW: 12.9 % (ref 11.5–15.5)
WBC: 9.8 10*3/uL (ref 4.0–10.5)
nRBC: 0 % (ref 0.0–0.2)

## 2019-03-08 LAB — TSH: TSH: 0.754 u[IU]/mL (ref 0.350–4.500)

## 2019-03-08 MED ORDER — SERTRALINE HCL 50 MG PO TABS
25.0000 mg | ORAL_TABLET | Freq: Every day | ORAL | Status: DC
  Administered 2019-03-08 – 2019-03-10 (×3): 25 mg via ORAL
  Filled 2019-03-08 (×3): qty 1

## 2019-03-08 MED ORDER — SODIUM CHLORIDE 0.9 % IV SOLN
INTRAVENOUS | Status: DC
  Administered 2019-03-08 – 2019-03-10 (×3): via INTRAVENOUS

## 2019-03-08 MED ORDER — ONDANSETRON HCL 4 MG/2ML IJ SOLN: 4.0000 mg | Freq: Four times a day (QID) | INTRAMUSCULAR | Status: DC | PRN

## 2019-03-08 MED ORDER — ASPIRIN EC 81 MG PO TBEC
81.0000 mg | DELAYED_RELEASE_TABLET | Freq: Every day | ORAL | Status: DC
  Administered 2019-03-09 – 2019-03-10 (×2): 81 mg via ORAL
  Filled 2019-03-08 (×2): qty 1

## 2019-03-08 MED ORDER — ACETAMINOPHEN 325 MG PO TABS
650.0000 mg | ORAL_TABLET | Freq: Four times a day (QID) | ORAL | Status: DC | PRN
  Administered 2019-03-08 – 2019-03-09 (×2): 650 mg via ORAL
  Filled 2019-03-08 (×2): qty 2

## 2019-03-08 MED ORDER — ENOXAPARIN SODIUM 40 MG/0.4ML ~~LOC~~ SOLN
40.0000 mg | SUBCUTANEOUS | Status: DC
  Administered 2019-03-08 – 2019-03-09 (×2): 40 mg via SUBCUTANEOUS
  Filled 2019-03-08 (×2): qty 0.4

## 2019-03-08 MED ORDER — IBUPROFEN 400 MG PO TABS
400.0000 mg | ORAL_TABLET | Freq: Once | ORAL | Status: AC
  Administered 2019-03-08: 400 mg via ORAL
  Filled 2019-03-08: qty 1

## 2019-03-08 MED ORDER — SODIUM CHLORIDE 0.9 % IV BOLUS
1000.0000 mL | Freq: Once | INTRAVENOUS | Status: AC
  Administered 2019-03-08: 1000 mL via INTRAVENOUS

## 2019-03-08 MED ORDER — ONDANSETRON HCL 4 MG PO TABS: 4.0000 mg | ORAL_TABLET | Freq: Four times a day (QID) | ORAL | Status: DC | PRN

## 2019-03-08 MED ORDER — ROSUVASTATIN CALCIUM 10 MG PO TABS
40.0000 mg | ORAL_TABLET | Freq: Every day | ORAL | Status: DC
  Administered 2019-03-09: 40 mg via ORAL
  Filled 2019-03-08: qty 4
  Filled 2019-03-08: qty 2

## 2019-03-08 MED ORDER — CLOPIDOGREL BISULFATE 75 MG PO TABS
75.0000 mg | ORAL_TABLET | Freq: Every day | ORAL | Status: DC
  Administered 2019-03-09 – 2019-03-10 (×2): 75 mg via ORAL
  Filled 2019-03-08 (×2): qty 1

## 2019-03-08 MED ORDER — ACETAMINOPHEN 650 MG RE SUPP: 650.0000 mg | Freq: Four times a day (QID) | RECTAL | Status: DC | PRN

## 2019-03-08 NOTE — ED Notes (Signed)
Pt is able to ambulate to toilet w/a steady gait. Pt c/o of a "slight" headache.

## 2019-03-08 NOTE — ED Notes (Signed)
This RN attempted to call report to assigned room 245 w/o success.

## 2019-03-08 NOTE — ED Notes (Signed)
Patient denies pain and is resting comfortably.  

## 2019-03-08 NOTE — ED Notes (Signed)
Report off to [facility]

## 2019-03-08 NOTE — ED Provider Notes (Signed)
[facility] Emergency Department Provider Note   ____________________________________________   First MD Initiated Contact with Patient 03/08/19 1346     (approximate)  I have reviewed the triage vital signs and the nursing notes.   HISTORY  Chief Complaint Loss of Consciousness   HPI Alyssa Watson is a 63 y.o. female who said she was standing in the kitchen when she suddenly got very woozy and lightheaded and her vision change she got sweaty and passed out.  She says she stood up again and passed out again but does not remember what happened the second time.  Patient reports she has had several episodes similar to this while sitting in much she got weak and dizzy and almost passed out but did not actually do it.  Patient reports she has a blockage in her left carotid that is under evaluation her hearing a bruit there and she has had a stent placed in her heart.  Patient has not had any chest pain or shortness of breath with this episode of syncope.  She did report a headache which was bad but not severe prior to passing out.  It felt like her usual migraine.  Headache is now gone.  She does not think she hit her head.  She has no headache as I said no neck pain now no other pain anywhere else.         Past Medical History:  Diagnosis Date  . Anxiety and depression   . H/O uterine prolapse 03/2014  . Heart disease    h/o angioplasty  . Hyperlipemia   . Rash of face   . Sleep apnea   . Vaginal enterocele    1st degree cystocele    Patient Active Problem List   Diagnosis Date Noted  . Anxiety and depression 07/22/2015  . Cardiac disease 07/22/2015  . H/O elevated lipids 07/22/2015  . H/O disease 07/22/2015  . Enterocele 07/22/2015  . Tobacco abuse 07/22/2015    Past Surgical History:  Procedure Laterality Date  . ANGIOPLASTY  05/2013   stent placed  . CARPAL TUNNEL RELEASE Left   . VAGINAL HYSTERECTOMY  03/2014   with BSO    Prior to  Admission medications   Medication Sig Start Date End Date Taking? Authorizing Provider  ALPRAZolam Duanne Moron) 0.5 MG tablet Take 0.5 mg by mouth at bedtime as needed for anxiety.    [provider]  aspirin EC 81 MG tablet Take 81 mg by mouth daily.    [provider]  atorvastatin (LIPITOR) 80 MG tablet Take 80 mg by mouth daily.    [provider]  butalbital-acetaminophen-caffeine (FIORICET, ESGIC) 50-325-40 MG per tablet Take 1 tablet by mouth 2 (two) times daily as needed for headache.    [provider]  clopidogrel (PLAVIX) 75 MG tablet Take 75 mg by mouth daily.    [provider]  metoprolol tartrate (LOPRESSOR) 25 MG tablet Take 25 mg by mouth daily.    [provider]  sertraline (ZOLOFT) 25 MG tablet Take 25 mg by mouth daily.    [provider]    Allergies Codeine and Penicillins  History reviewed. No pertinent family history.  Social History Social History   Tobacco Use  . Smoking status: Current Every Day Smoker  . Smokeless tobacco: Current User  Substance Use Topics  . Alcohol use: No    Alcohol/week: 0.0 standard drinks  . Drug use: No    Review of Systems  Constitutional: No fever/chills Eyes: No visual changes. ENT: No sore throat. Cardiovascular: Denies chest pain. Respiratory: Denies shortness of breath. Gastrointestinal: No abdominal pain.  No nausea, no vomiting.  No diarrhea.  No constipation. Genitourinary: Negative for dysuria. Musculoskeletal: Negative for back pain. Skin: Negative for rash. Neurological: Negative for headaches, focal weakness or  ____________________________________________   PHYSICAL EXAM:  VITAL SIGNS: ED Triage Vitals  Enc Vitals Group     BP 03/08/19 1319 (!) 135/59     Pulse Rate 03/08/19 1318 (!) 58     Resp 03/08/19 1318 20     Temp 03/08/19 1320 98.6 F (37 C)     Temp Source 03/08/19 1320 Oral     SpO2 03/08/19 1318 100 %     Weight 03/08/19 1321  112 lb (50.8 kg)     Height 03/08/19 1321 5\' 1"  (1.549 m)     Head Circumference --      Peak Flow --      Pain Score 03/08/19 1321 5     Pain Loc --      Pain Edu? --      Excl. in [place]? --     Constitutional: Alert and oriented. Well appearing and in no acute distress. Eyes: Conjunctivae are normal. Head: Atraumatic. Nose: No congestion/rhinnorhea. Mouth/Throat: Mucous membranes are moist.  Oropharynx non-erythematous. Neck: No stridor.   Cardiovascular: Normal rate, regular rhythm. Grossly normal heart sounds.  Good peripheral circulation. Respiratory: Normal respiratory effort.  No retractions. Lungs CTAB. Gastrointestinal: Soft and nontender. No distention. No abdominal bruits. No CVA tenderness. Musculoskeletal: No lower extremity tenderness nor edema.  Neurologic:  Normal speech and language. No gross focal neurologic deficits are appreciated.  Skin:  Skin is warm, dry and intact. No rash noted.   ____________________________________________   LABS (all labs ordered are listed, but only abnormal results are displayed)  Labs Reviewed  BASIC METABOLIC PANEL - Abnormal; Notable for the following components:      Result Value   Glucose, Bld 107 (*)    All other components within normal limits  CBC  TROPONIN I  TROPONIN I   ____________________________________________  EKG  EKG read and interpreted by me shows normal sinus rhythm rate of 60 normal axis no acute ST-T changes ____________________________________________  RADIOLOGY  ED MD interpretation: Dust x-ray read by radiology reviewed by me is normal  Official radiology report(s): Dg Chest 2 View  Result Date: 03/08/2019 CLINICAL DATA:  Syncope EXAM: CHEST - 2 VIEW COMPARISON:  03/26/2014 FINDINGS: The heart size and mediastinal contours are within normal limits. Both lungs are clear. The visualized skeletal structures are unremarkable. IMPRESSION: No active cardiopulmonary disease. Electronically Signed   By:  Franchot Gallo M.D.   On: 03/08/2019 13:56   Ct Head Wo Contrast  Result Date: 03/08/2019 CLINICAL DATA:  Several recent syncopal episodes EXAM: CT HEAD WITHOUT CONTRAST TECHNIQUE: Contiguous axial images were obtained from the base of the skull through the vertex without intravenous contrast. COMPARISON:  None. FINDINGS: Brain: The ventricles are normal in size and configuration. There is no intracranial mass, hemorrhage, extra-axial fluid collection, or midline shift. The brain parenchyma appears unremarkable. No acute infarct evident. Vascular: No hyperdense vessel. There is mild calcification in the distal right internal carotid artery. Skull: Bony calvarium appears intact. Sinuses/Orbits: Visualized paranasal sinuses are clear. Note that frontal sinuses are aplastic. Visualized orbits appear symmetric bilaterally. Other: Visualized mastoid air cells are clear. IMPRESSION: Brain parenchyma appears unremarkable.  No mass or hemorrhage. There  is slight arterial vascular calcification. Electronically Signed   By: Lowella Grip III M.D.   On: 03/08/2019 14:38    ____________________________________________   PROCEDURES  Procedure(s) performed (including Critical Care):  Procedures   ____________________________________________   INITIAL IMPRESSION / ASSESSMENT AND PLAN / ED COURSE Patient has had prior episodes of near syncope and passed out twice today.  Additionally she has had a stent in her heart and has a carotid bruit.  I think at this point the safest thing to do for this lady would be to watch her overnight even in spite of the coronavirus epidemic.               ____________________________________________   FINAL CLINICAL IMPRESSION(S) / ED DIAGNOSES  Final diagnoses:  Syncope and collapse     ED Discharge Orders    None       Note:  This document was prepared using Dragon voice recognition software and may include unintentional dictation errors.     Nena Polio, MD 03/08/19 [phone]

## 2019-03-08 NOTE — ED Triage Notes (Signed)
Pt brought in via ems from home with syncopal episode today while in the kitchen.  No chest pain or sob.  No n/v/d.  Pt reports abd pain after passing out.  Pt alert  Speech clear.  Iv in place.

## 2019-03-08 NOTE — ED Notes (Signed)
Patient transported to CT 

## 2019-03-08 NOTE — Progress Notes (Signed)
Pt admitted this shift. Settled in room. Headache addressed and resolved. Pt is sleeping at this time.

## 2019-03-08 NOTE — ED Notes (Signed)
ED TO INPATIENT HANDOFF REPORT  ED Nurse Name and Phone #: Jewell Haught [phone]  S Name/Age/Gender Alyssa Watson 63 y.o. female Room/Bed: ED03A/ED03A  Code Status   Code Status: Not on file  Home/SNF/Other Home Patient oriented to: A&Ox4 Is this baseline? YES  Triage Complete: Triage complete  Chief Complaint Syncopal episode  Triage Note Pt brought in via ems from home with syncopal episode today while in the kitchen.  No chest pain or sob.  No n/v/d.  Pt reports abd pain after passing out.  Pt alert  Speech clear.  Iv in place.    Allergies Allergies  Allergen Reactions  . Codeine Rash  . Penicillins Rash    Level of Care/Admitting Diagnosis ED Disposition    ED Disposition Condition [facility]: [place] [100120]  Level of Care: Telemetry [5]  Diagnosis: Syncope and collapse [780.2.ICD-9-CM]  Admitting Physician: Dustin Flock [[id number]]  Attending Physician: Dustin Flock [[id number]]  PT Class (Do Not Modify): Observation [104]  PT Acc Code (Do Not Modify): Observation [10022]       B Medical/Surgery History Past Medical History:  Diagnosis Date  . Anxiety and depression   . H/O uterine prolapse 03/2014  . Heart disease    h/o angioplasty  . Hyperlipemia   . Rash of face   . Sleep apnea   . Vaginal enterocele    1st degree cystocele   Past Surgical History:  Procedure Laterality Date  . ANGIOPLASTY  05/2013   stent placed  . CARPAL TUNNEL RELEASE Left   . VAGINAL HYSTERECTOMY  03/2014   with BSO     A IV Location/Drains/Wounds Patient Lines/Drains/Airways Status   Active Line/Drains/Airways    Name:   Placement date:   Placement time:   Site:   Days:   Peripheral IV 03/08/19 Left Arm   03/08/19    1323    Arm   less than 1          Intake/Output Last 24 hours No intake or output data in the 24 hours ending 03/08/19 1719  Labs/Imaging Results for orders placed or performed during the hospital  encounter of 03/08/19 (from the past 48 hour(s))  Basic metabolic panel     Status: Abnormal   Collection Time: 03/08/19  1:17 PM  Result Value Ref Range   Sodium 139 135 - 145 mmol/L   Potassium 4.2 3.5 - 5.1 mmol/L   Chloride 103 98 - 111 mmol/L   CO2 25 22 - 32 mmol/L   Glucose, Bld 107 (H) 70 - 99 mg/dL   BUN 11 8 - 23 mg/dL   Creatinine, Ser 0.65 0.44 - 1.00 mg/dL   Calcium 9.3 8.9 - 10.3 mg/dL   GFR calc non Af Amer >60 >60 mL/min   GFR calc Af Amer >60 >60 mL/min   Anion gap 11 5 - 15    Comment: Performed at [facility], [address] [place], [place] [address]  CBC     Status: None   Collection Time: 03/08/19  1:17 PM  Result Value Ref Range   WBC 9.8 4.0 - 10.5 K/uL   RBC 4.20 3.87 - 5.11 MIL/uL   Hemoglobin 12.8 12.0 - 15.0 g/dL   HCT 39.5 36.0 - 46.0 %   MCV 94.0 80.0 - 100.0 fL   MCH 30.5 26.0 - 34.0 pg   MCHC 32.4 30.0 - 36.0 g/dL   RDW 12.9 11.5 - 15.5 %  Platelets 152 150 - 400 K/uL   nRBC 0.0 0.0 - 0.2 %    Comment: Performed at [facility], [address] [place], [place] [address]  Troponin I - ONCE - STAT     Status: None   Collection Time: 03/08/19  1:17 PM  Result Value Ref Range   Troponin I <0.03 <0.03 ng/mL    Comment: Performed at [facility], [address] [place], [place] [address]  Troponin I - Once     Status: None   Collection Time: 03/08/19  3:39 PM  Result Value Ref Range   Troponin I <0.03 <0.03 ng/mL    Comment: Performed at [facility], [address] [place], [place] [address]   Dg Chest 2 View  Result Date: 03/08/2019 CLINICAL DATA:  Syncope EXAM: CHEST - 2 VIEW COMPARISON:  03/26/2014 FINDINGS: The heart size and mediastinal contours are within normal limits. Both lungs are clear. The visualized skeletal structures are unremarkable. IMPRESSION: No active cardiopulmonary disease. Electronically Signed   By: Franchot Gallo M.D.   On: 03/08/2019 13:56   Ct Head Wo Contrast  Result  Date: 03/08/2019 CLINICAL DATA:  Several recent syncopal episodes EXAM: CT HEAD WITHOUT CONTRAST TECHNIQUE: Contiguous axial images were obtained from the base of the skull through the vertex without intravenous contrast. COMPARISON:  None. FINDINGS: Brain: The ventricles are normal in size and configuration. There is no intracranial mass, hemorrhage, extra-axial fluid collection, or midline shift. The brain parenchyma appears unremarkable. No acute infarct evident. Vascular: No hyperdense vessel. There is mild calcification in the distal right internal carotid artery. Skull: Bony calvarium appears intact. Sinuses/Orbits: Visualized paranasal sinuses are clear. Note that frontal sinuses are aplastic. Visualized orbits appear symmetric bilaterally. Other: Visualized mastoid air cells are clear. IMPRESSION: Brain parenchyma appears unremarkable.  No mass or hemorrhage. There is slight arterial vascular calcification. Electronically Signed   By: Lowella Grip III M.D.   On: 03/08/2019 14:38    Pending Labs [employer] (From admission, onward)    Start     Ordered   Signed and Held  CBC  (enoxaparin (LOVENOX)    CrCl >/= 30 ml/min)  Once,   R    Comments:  Baseline for enoxaparin therapy IF NOT ALREADY DRAWN.  Notify MD if PLT < 100 K.    Signed and Held   Signed and Held  Creatinine, serum  (enoxaparin (LOVENOX)    CrCl >/= 30 ml/min)  Once,   R    Comments:  Baseline for enoxaparin therapy IF NOT ALREADY DRAWN.    Signed and Held   Signed and Held  Creatinine, serum  (enoxaparin (LOVENOX)    CrCl >/= 30 ml/min)  Weekly,   R    Comments:  while on enoxaparin therapy    Signed and Held   Signed and Held  TSH  Once,   R     Signed and Held          Vitals/Pain Today's Vitals   03/08/19 1541 03/08/19 1614 03/08/19 1630 03/08/19 1656  BP: (!) 113/58 (!) 103/59 99/62 (!) 116/59  Pulse: 61 (!) 58 (!) 58 65  Resp: 15 14 14 13   Temp: 98 F (36.7 C)     TempSrc: Oral     SpO2: 100% 97%  98% 100%  Weight:      Height:      PainSc:        Isolation Precautions No active isolations  Medications  Medications  rosuvastatin (CRESTOR) tablet 40 mg (has no administration in time range)  sodium chloride 0.9 % bolus 1,000 mL (1,000 mLs Intravenous New Bag/Given 03/08/19 1640)    Mobility walks Low fall risk   Focused Assessments    R Recommendations: See Admitting Provider Note  Report given to:   Additional Notes: pt able to ambulate to toilet with a steady gait. Pt c/o a "slight" headache at this time. Pt is A/Ox4

## 2019-03-08 NOTE — ED Notes (Signed)
Pt brought in via ems from home. Pt had a syncopal episode today in the kitchen.  No chest pain or sob.  No n/v/d.  No headache.  Pt alert  Speech clear.  Iv in place.  Sinus on monitor.  Skin warm and dry.

## 2019-03-08 NOTE — H&P (Signed)
[facility] [place] NAME: Alyssa Watson    MR#:  [id number]  DATE OF BIRTH:  1956-01-29  DATE OF ADMISSION:  03/08/2019  PRIMARY CARE PHYSICIAN: System, Pcp Not In   REQUESTING/REFERRING PHYSICIAN: Nena Polio, MD  CHIEF COMPLAINT:   Chief Complaint  Patient presents with  . Loss of Consciousness    HISTORY OF PRESENT ILLNESS: Alyssa Watson  is a 64 y.o. female with a known history of anxiety, depression, coronary artery disease status post stent, sleep apnea who states that she has been feeling dizzy for the past 6 months.  She says that she feels like that most of the time when she is sitting she has had 3 episodes of this but today she was standing and passed out.  Then she sat down and try to walk again and passed out again.  In the emergency room patient is noted to have episodes of low blood pressure in the upper 90s.  She denies any chest pain or palpitations.  Denies any nausea vomiting or diarrhea.  Denies any fevers chills.     PAST MEDICAL HISTORY:   Past Medical History:  Diagnosis Date  . Anxiety and depression   . H/O uterine prolapse 03/2014  . Heart disease    h/o angioplasty  . Hyperlipemia   . Rash of face   . Sleep apnea   . Vaginal enterocele    1st degree cystocele    PAST SURGICAL HISTORY:  Past Surgical History:  Procedure Laterality Date  . ANGIOPLASTY  05/2013   stent placed  . CARPAL TUNNEL RELEASE Left   . VAGINAL HYSTERECTOMY  03/2014   with BSO    SOCIAL HISTORY:  Social History   Tobacco Use  . Smoking status: Current Every Day Smoker  . Smokeless tobacco: Current User  Substance Use Topics  . Alcohol use: No    Alcohol/week: 0.0 standard drinks    FAMILY HISTORY: History reviewed. No pertinent family history.  DRUG ALLERGIES:  Allergies  Allergen Reactions  . Codeine Rash  . Penicillins Rash    REVIEW OF SYSTEMS:   CONSTITUTIONAL: No fever, fatigue or weakness.  EYES: No  blurred or double vision.  EARS, NOSE, AND THROAT: No tinnitus or ear pain.  RESPIRATORY: No cough, shortness of breath, wheezing or hemoptysis.  CARDIOVASCULAR: No chest pain, orthopnea, edema.  Positive syncope GASTROINTESTINAL: No nausea, vomiting, diarrhea or abdominal pain.  GENITOURINARY: No dysuria, hematuria.  ENDOCRINE: No polyuria, nocturia,  HEMATOLOGY: No anemia, easy bruising or bleeding SKIN: No rash or lesion. MUSCULOSKELETAL: No joint pain or arthritis.   NEUROLOGIC: No tingling, numbness, weakness.  PSYCHIATRY: No anxiety or depression.   MEDICATIONS AT HOME:  Prior to Admission medications   Medication Sig Start Date End Date Taking? Authorizing Provider  ALPRAZolam Duanne Moron) 0.5 MG tablet Take 0.5 mg by mouth at bedtime as needed for anxiety.    [provider]  aspirin EC 81 MG tablet Take 81 mg by mouth daily.    [provider]  atorvastatin (LIPITOR) 80 MG tablet Take 80 mg by mouth daily.    [provider]  butalbital-acetaminophen-caffeine (FIORICET, ESGIC) 50-325-40 MG per tablet Take 1 tablet by mouth 2 (two) times daily as needed for headache.    [provider]  clopidogrel (PLAVIX) 75 MG tablet Take 75 mg by mouth daily.    [provider]  metoprolol tartrate (LOPRESSOR) 25 MG tablet Take 25 mg by mouth  daily.    [provider]  sertraline (ZOLOFT) 25 MG tablet Take 25 mg by mouth daily.    [provider]      PHYSICAL EXAMINATION:   VITAL SIGNS: Blood pressure 99/62, pulse (!) 58, temperature 98 F (36.7 C), temperature source Oral, resp. rate 14, height 5\' 1"  (1.549 m), weight 50.8 kg, SpO2 98 %.  GENERAL:  63 y.o.-year-old patient lying in the bed with no acute distress.  EYES: Pupils equal, round, reactive to light and accommodation. No scleral icterus. Extraocular muscles intact.  HEENT: Head atraumatic, normocephalic. Oropharynx and nasopharynx clear.  NECK:  Supple, no jugular  venous distention. No thyroid enlargement, no tenderness.  LUNGS: Normal breath sounds bilaterally, no wheezing, rales,rhonchi or crepitation. No use of accessory muscles of respiration.  CARDIOVASCULAR: S1, S2 normal. No murmurs, rubs, or gallops.  ABDOMEN: Soft, nontender, nondistended. Bowel sounds present. No organomegaly or mass.  EXTREMITIES: No pedal edema, cyanosis, or clubbing.  NEUROLOGIC: Cranial nerves II through XII are intact. Muscle strength 5/5 in all extremities. Sensation intact. Gait not checked.  PSYCHIATRIC: The patient is alert and oriented x 3.  SKIN: No obvious rash, lesion, or ulcer.   LABORATORY PANEL:   CBC Recent Labs  Lab 03/08/19 1317  WBC 9.8  HGB 12.8  HCT 39.5  PLT 152  MCV 94.0  MCH 30.5  MCHC 32.4  RDW 12.9   ------------------------------------------------------------------------------------------------------------------  Chemistries  Recent Labs  Lab 03/08/19 1317  NA 139  K 4.2  CL 103  CO2 25  GLUCOSE 107*  BUN 11  CREATININE 0.65  CALCIUM 9.3   ------------------------------------------------------------------------------------------------------------------ estimated creatinine clearance is 53.6 mL/min (by C-G formula based on SCr of 0.65 mg/dL). ------------------------------------------------------------------------------------------------------------------ No results for input(s): TSH, T4TOTAL, T3FREE, THYROIDAB in the last 72 hours.  Invalid input(s): FREET3   Coagulation profile No results for input(s): INR, PROTIME in the last 168 hours. ------------------------------------------------------------------------------------------------------------------- No results for input(s): DDIMER in the last 72 hours. -------------------------------------------------------------------------------------------------------------------  Cardiac Enzymes Recent Labs  Lab 03/08/19 1317 03/08/19 1539  TROPONINI <0.03 <0.03    ------------------------------------------------------------------------------------------------------------------ Invalid input(s): POCBNP  ---------------------------------------------------------------------------------------------------------------  Urinalysis    Component Value Date/Time   COLORURINE COLORLESS (A) 05/24/2018 1941   APPEARANCEUR CLEAR (A) 05/24/2018 1941   LABSPEC 1.003 (L) 05/24/2018 1941   PHURINE 7.0 05/24/2018 1941   GLUCOSEU NEGATIVE 05/24/2018 1941   HGBUR SMALL (A) 05/24/2018 1941   BILIRUBINUR NEGATIVE 05/24/2018 1941   KETONESUR NEGATIVE 05/24/2018 1941   PROTEINUR NEGATIVE 05/24/2018 1941   NITRITE NEGATIVE 05/24/2018 1941   LEUKOCYTESUR NEGATIVE 05/24/2018 1941     RADIOLOGY: Dg Chest 2 View  Result Date: 03/08/2019 CLINICAL DATA:  Syncope EXAM: CHEST - 2 VIEW COMPARISON:  03/26/2014 FINDINGS: The heart size and mediastinal contours are within normal limits. Both lungs are clear. The visualized skeletal structures are unremarkable. IMPRESSION: No active cardiopulmonary disease. Electronically Signed   By: Franchot Gallo M.D.   On: 03/08/2019 13:56   Ct Head Wo Contrast  Result Date: 03/08/2019 CLINICAL DATA:  Several recent syncopal episodes EXAM: CT HEAD WITHOUT CONTRAST TECHNIQUE: Contiguous axial images were obtained from the base of the skull through the vertex without intravenous contrast. COMPARISON:  None. FINDINGS: Brain: The ventricles are normal in size and configuration. There is no intracranial mass, hemorrhage, extra-axial fluid collection, or midline shift. The brain parenchyma appears unremarkable. No acute infarct evident. Vascular: No hyperdense vessel. There is mild calcification in the distal right internal carotid artery. Skull: Bony calvarium appears intact.  Sinuses/Orbits: Visualized paranasal sinuses are clear. Note that frontal sinuses are aplastic. Visualized orbits appear symmetric bilaterally. Other: Visualized mastoid air  cells are clear. IMPRESSION: Brain parenchyma appears unremarkable.  No mass or hemorrhage. There is slight arterial vascular calcification. Electronically Signed   By: Lowella Grip III M.D.   On: 03/08/2019 14:38    EKG: Orders placed or performed during the hospital encounter of 03/08/19  . EKG 12-Lead  . EKG 12-Lead  . ED EKG within 10 minutes  . ED EKG within 10 minutes    IMPRESSION AND PLAN: Patient is a 63 year old white female presenting with syncope  1.  Syncope we will place patient on telemetry Check orthostatic vitals Give IV fluid Monitor on telemetry Check carotid Doppler Obtain echocardiogram of the heart Due to low blood pressure I will hold her metoprolol that she is taking chronically  2.  Coronary artery disease continue Plavix and  3.  Hyperlipidemia continue rosuvastatin 40 mg daily  4.  Teen abuse smoking cessation provided 4 minutes spent strongly recommend she stop smoking      All the records are reviewed and case discussed with ED provider. Management plans discussed with the patient, family and they are in agreement.  CODE STATUS:full    TOTAL TIME TAKING CARE OF THIS PATIENT: 55 minutes.    Dustin Flock M.D on 03/08/2019 at 4:44 PM  Between 7am to 6pm - Pager - [phone]  After 6pm go to [email or web address] - password [email or web address] [facility]  [employer] Office  [phone]  CC: Primary care physician; System, Pcp Not In

## 2019-03-09 ENCOUNTER — Observation Stay: Payer: BLUE CROSS/BLUE SHIELD

## 2019-03-09 ENCOUNTER — Observation Stay
Admit: 2019-03-09 | Discharge: 2019-03-09 | Disposition: A | Discharge status: Home / Self Care | Payer: BLUE CROSS/BLUE SHIELD | Attending: Internal Medicine | Admitting: Internal Medicine

## 2019-03-09 DIAGNOSIS — Z7902 Long term (current) use of antithrombotics/antiplatelets: Secondary | ICD-10-CM

## 2019-03-09 DIAGNOSIS — Z79899 Other long term (current) drug therapy: Secondary | ICD-10-CM

## 2019-03-09 DIAGNOSIS — E785 Hyperlipidemia, unspecified: Secondary | ICD-10-CM | POA: Diagnosis not present

## 2019-03-09 DIAGNOSIS — I10 Essential (primary) hypertension: Secondary | ICD-10-CM | POA: Diagnosis not present

## 2019-03-09 DIAGNOSIS — I6522 Occlusion and stenosis of left carotid artery: Secondary | ICD-10-CM

## 2019-03-09 DIAGNOSIS — F17200 Nicotine dependence, unspecified, uncomplicated: Secondary | ICD-10-CM

## 2019-03-09 DIAGNOSIS — I2510 Atherosclerotic heart disease of native coronary artery without angina pectoris: Secondary | ICD-10-CM | POA: Diagnosis not present

## 2019-03-09 MED ORDER — IOHEXOL 350 MG/ML SOLN
75.0000 mL | Freq: Once | INTRAVENOUS | Status: AC | PRN
  Administered 2019-03-09: 75 mL via INTRAVENOUS

## 2019-03-09 MED ORDER — IBUPROFEN 400 MG PO TABS
400.0000 mg | ORAL_TABLET | Freq: Once | ORAL | Status: AC | PRN
  Administered 2019-03-09: 400 mg via ORAL
  Filled 2019-03-09: qty 1

## 2019-03-09 NOTE — Progress Notes (Signed)
[facility] [facility] at [place] NAME: Alyssa Watson    MR#:  [id number]  DATE OF BIRTH:  July 31, 1956  SUBJECTIVE:   Patient states she is feeling a little bit better this morning.  She was able to walk around her room without having any dizziness.  She denies any chest pain, shortness of breath, palpitations.  REVIEW OF SYSTEMS:  Review of Systems  Constitutional: Negative for chills and fever.  HENT: Negative for congestion and sore throat.   Eyes: Negative for blurred vision and double vision.  Respiratory: Negative for cough and shortness of breath.   Cardiovascular: Negative for chest pain, palpitations and leg swelling.  Gastrointestinal: Negative for nausea and vomiting.  Genitourinary: Negative for dysuria and urgency.  Musculoskeletal: Negative for back pain and neck pain.  Neurological: Negative for dizziness and headaches.  Psychiatric/Behavioral: Negative for depression. The patient is not nervous/anxious.     DRUG ALLERGIES:   Allergies  Allergen Reactions   Codeine Rash   Penicillins Rash    Did it involve swelling of the face/tongue/throat, SOB, or low BP? No Did it involve sudden or severe rash/hives, skin peeling, or any reaction on the inside of your mouth or nose? No Did you need to seek medical attention at a hospital or doctor's office? No When did it last happen?20 years ago If all above answers are NO, may proceed with cephalosporin use.   VITALS:  Blood pressure 139/64, pulse 63, temperature 98.5 F (36.9 C), temperature source Oral, resp. rate 18, height 5\' 1"  (1.549 m), weight 50.8 kg, SpO2 97 %. PHYSICAL EXAMINATION:  Physical Exam  GENERAL:  Laying in the bed with no acute distress.  Thin appearing. HEENT: Head atraumatic, normocephalic. Pupils equal, round, reactive to light and accommodation. No scleral icterus. Extraocular muscles intact. Oropharynx and nasopharynx clear.  NECK:  Supple, no jugular  venous distention. No thyroid enlargement. LUNGS: Lungs are clear to auscultation bilaterally. No wheezes, crackles, rhonchi. No use of accessory muscles of respiration.  CARDIOVASCULAR: RRR, S1, S2 normal. No murmurs, rubs, or gallops.  ABDOMEN: Soft, nontender, nondistended. Bowel sounds present.  EXTREMITIES: No pedal edema, cyanosis, or clubbing.  NEUROLOGIC: CN 2-12 intact, no focal deficits. 5/5 muscle strength throughout all extremities. Sensation intact throughout. Gait not checked.  PSYCHIATRIC: The patient is alert and oriented x 3.  SKIN: No obvious rash, lesion, or ulcer.   LABORATORY PANEL:  Female CBC Recent Labs  Lab 03/08/19 1317  WBC 9.8  HGB 12.8  HCT 39.5  PLT 152   ------------------------------------------------------------------------------------------------------------------ Chemistries  Recent Labs  Lab 03/08/19 1317  NA 139  K 4.2  CL 103  CO2 25  GLUCOSE 107*  BUN 11  CREATININE 0.65  CALCIUM 9.3   RADIOLOGY:  Dg Chest 2 View  Result Date: 03/08/2019 CLINICAL DATA:  Syncope EXAM: CHEST - 2 VIEW COMPARISON:  03/26/2014 FINDINGS: The heart size and mediastinal contours are within normal limits. Both lungs are clear. The visualized skeletal structures are unremarkable. IMPRESSION: No active cardiopulmonary disease. Electronically Signed   By: Franchot Gallo M.D.   On: 03/08/2019 13:56   Ct Head Wo Contrast  Result Date: 03/08/2019 CLINICAL DATA:  Several recent syncopal episodes EXAM: CT HEAD WITHOUT CONTRAST TECHNIQUE: Contiguous axial images were obtained from the base of the skull through the vertex without intravenous contrast. COMPARISON:  None. FINDINGS: Brain: The ventricles are normal in size and configuration. There is no intracranial mass, hemorrhage, extra-axial fluid collection, or  midline shift. The brain parenchyma appears unremarkable. No acute infarct evident. Vascular: No hyperdense vessel. There is mild calcification in the distal right  internal carotid artery. Skull: Bony calvarium appears intact. Sinuses/Orbits: Visualized paranasal sinuses are clear. Note that frontal sinuses are aplastic. Visualized orbits appear symmetric bilaterally. Other: Visualized mastoid air cells are clear. IMPRESSION: Brain parenchyma appears unremarkable.  No mass or hemorrhage. There is slight arterial vascular calcification. Electronically Signed   By: Lowella Grip III M.D.   On: 03/08/2019 14:38   US Carotid Bilateral  Result Date: 03/09/2019 CLINICAL DATA:  Syncope, visual disturbance, hyperlipidemia EXAM: BILATERAL CAROTID DUPLEX ULTRASOUND TECHNIQUE: Pearline Cables scale imaging, color Doppler and duplex ultrasound were performed of bilateral carotid and vertebral arteries in the neck. COMPARISON:  None. FINDINGS: Criteria: Quantification of carotid stenosis is based on velocity parameters that correlate the residual internal carotid diameter with NASCET-based stenosis levels, using the diameter of the distal internal carotid lumen as the denominator for stenosis measurement. The following velocity measurements were obtained: RIGHT ICA: 189/65 cm/sec CCA: 614/43 cm/sec SYSTOLIC ICA/CCA RATIO:  1.7 ECA: 214 cm/sec LEFT ICA: 360/75 cm/sec CCA: 15/40 cm/sec SYSTOLIC ICA/CCA RATIO:  3.9 ECA: 118 cm/sec RIGHT CAROTID ARTERY: Tortuous right distal ICA. Mild atherosclerotic change. Tortuosity accounts for mild distal ICA velocity elevation. No hemodynamically significant ICA stenosis, abnormal velocity elevation, or turbulent flow. Degree of narrowing remains estimated less than 50% by ultrasound criteria. RIGHT VERTEBRAL ARTERY:  Antegrade LEFT CAROTID ARTERY: Moderately severe left ICA atherosclerosis with luminal narrowing by grayscale imaging. There is mid ICA velocity elevation measuring 160/75 centimeters/second with some degree of turbulent flow. By ultrasound criteria, left ICA stenosis estimated at greater than 70%. LEFT VERTEBRAL ARTERY:  Antegrade IMPRESSION:  Left ICA stenosis estimated at greater than 70% by ultrasound criteria Right ICA narrowing less than 50% Patent antegrade vertebral flow bilaterally Electronically Signed   By: Jerilynn Mages.  Shick M.D.   On: 03/09/2019 09:55   ASSESSMENT AND PLAN:   Syncope of unknown etiology- orthostatics negative.  May be related to intermittent hypotension. -Carotid Dopplers with >70% stenosis on the left, although would have to have bilateral stenosis to cause syncope -Echo pending -Continue metoprolol -Cardiac monitoring -Continue IV fluids Check orthostatic vitals Give IV fluid  Left carotid artery stenosis-seen on carotid Doppler ultrasound today -Already on aspirin, Plavix, statin -Vascular surgery consult  Coronary artery disease-able, no active chest pain -Continue Plavix and aspirin -Metoprolol due to hypotension  Hyperlipidemia-stable -Continue home Crestor  Tobacco use -Tobacco cessation counseling performed this admission  All the records are reviewed and case discussed with Care Management/Social Worker. Management plans discussed with the patient, family and they are in agreement.  CODE STATUS: Full Code  TOTAL TIME TAKING CARE OF THIS PATIENT: 40 minutes.   More than 50% of the time was spent in counseling/coordination of care: YES  POSSIBLE D/C IN 1-2 DAYS, DEPENDING ON CLINICAL CONDITION.   Berna Spare Jden Want M.D on 03/09/2019 at 1:10 PM  Between 7am to 6pm - Pager - [phone]  After 6pm go to [email or web address] - Proofreader  [employer] [facility]  Office  [phone]  CC: Primary care physician; System, Pcp Not In  Note: This dictation was prepared with Dragon dictation along with smaller phrase technology. Any transcriptional errors that result from this process are unintentional.

## 2019-03-09 NOTE — Progress Notes (Signed)
*  PRELIMINARY RESULTS* Echocardiogram 2D Echocardiogram has been performed.  Alyssa Watson 03/09/2019, 2:22 PM

## 2019-03-09 NOTE — Progress Notes (Signed)
Pt refusing bed alarm but was educated about safety. Will continue to monitor.

## 2019-03-09 NOTE — Consult Note (Signed)
[facility] Vascular Consult Note  MRN : [id number]  Alyssa Watson is a 63 y.o. (05/06/1956) female who presents with chief complaint of  Chief Complaint  Patient presents with  . Loss of Consciousness  .  History of Present Illness:   I am asked to evaluate the patient by Dr. Brett Albino.  She is a 63 year old woman known to my service who presented to [facility] after 2 episodes of blacking out.  The patient describes it as 2 episodes with brief light headedness that then progresses to her blacking out.  She denies the "room spinning".  It lasted on the order of minutes and resolved completely.  There was no loss of consciousness.  There have been similar prior episodes over the past several years.  There is no recent history of TIA symptoms or focal motor deficits. There is no prior documented CVA.  The patient was not taking enteric-coated aspirin 81 mg daily at the time.  There is no history of migraine headaches or prior diagnosis of ocular migraine. There is no history of seizures.  The patient has a history of coronary artery disease, no recent episodes of angina or shortness of breath. The patient denies PAD or claudication symptoms. There is a history of hyperlipidemia which is being treated with a statin.    Current Facility-Administered Medications  Medication Dose Route Frequency Provider Last Rate Last Dose  . 0.9 %  sodium chloride infusion   Intravenous Continuous Dustin Flock, MD 75 mL/hr at 03/09/19 1029    . acetaminophen (TYLENOL) tablet 650 mg  650 mg Oral Q6H PRN Dustin Flock, MD   650 mg at 03/09/19 0626   Or  . acetaminophen (TYLENOL) suppository 650 mg  650 mg Rectal Q6H PRN Dustin Flock, MD      . aspirin EC tablet 81 mg  81 mg Oral Daily Dustin Flock, MD   81 mg at 03/09/19 0849  . clopidogrel (PLAVIX) tablet 75 mg  75 mg Oral Daily Dustin Flock, MD   75 mg at 03/09/19 0849  . enoxaparin (LOVENOX) injection 40 mg   40 mg Subcutaneous Q24H Dustin Flock, MD   40 mg at 03/08/19 2052  . ondansetron (ZOFRAN) tablet 4 mg  4 mg Oral Q6H PRN Dustin Flock, MD       Or  . ondansetron (ZOFRAN) injection 4 mg  4 mg Intravenous Q6H PRN Dustin Flock, MD      . rosuvastatin (CRESTOR) tablet 40 mg  40 mg Oral q1800 Dustin Flock, MD      . sertraline (ZOLOFT) tablet 25 mg  25 mg Oral Daily Dustin Flock, MD   25 mg at 03/09/19 9485    Past Medical History:  Diagnosis Date  . Anxiety and depression   . H/O uterine prolapse 03/2014  . Heart disease    h/o angioplasty  . Hyperlipemia   . Rash of face   . Sleep apnea   . Vaginal enterocele    1st degree cystocele    Past Surgical History:  Procedure Laterality Date  . ANGIOPLASTY  05/2013   stent placed  . CARPAL TUNNEL RELEASE Left   . VAGINAL HYSTERECTOMY  03/2014   with BSO    Social History Social History   Tobacco Use  . Smoking status: Current Every Day Smoker  . Smokeless tobacco: Current User  Substance Use Topics  . Alcohol use: No    Alcohol/week: 0.0 standard drinks  . Drug use: No  Family History History reviewed. No pertinent family history. No family history of bleeding/clotting disorders, porphyria or autoimmune disease   Allergies  Allergen Reactions  . Codeine Rash  . Penicillins Rash    Did it involve swelling of the face/tongue/throat, SOB, or low BP? No Did it involve sudden or severe rash/hives, skin peeling, or any reaction on the inside of your mouth or nose? No Did you need to seek medical attention at a hospital or doctor's office? No When did it last happen?20 years ago If all above answers are "NO", may proceed with cephalosporin use.     REVIEW OF SYSTEMS (Negative unless checked)  Constitutional: [] Weight loss  [] Fever  [] Chills Cardiac: [] Chest pain   [] Chest pressure   [] Palpitations   [] Shortness of breath when laying flat   [] Shortness of breath at rest   [] Shortness of breath with  exertion. Vascular:  [] Pain in legs with walking   [] Pain in legs at rest   [] Pain in legs when laying flat   [] Claudication   [] Pain in feet when walking  [] Pain in feet at rest  [] Pain in feet when laying flat   [] History of DVT   [] Phlebitis   [] Swelling in legs   [] Varicose veins   [] Non-healing ulcers Pulmonary:   [] Uses home oxygen   [] Productive cough   [] Hemoptysis   [] Wheeze  [] COPD   [] Asthma Neurologic:  [] Dizziness  [x] Blackouts   [] Seizures   [] History of stroke   [] History of TIA  [] Aphasia   [] Temporary blindness   [] Dysphagia   [] Weakness or numbness in arms   [] Weakness or numbness in legs Musculoskeletal:  [] Arthritis   [] Joint swelling   [] Joint pain   [] Low back pain Hematologic:  [] Easy bruising  [] Easy bleeding   [] Hypercoagulable state   [] Anemic  [] Hepatitis Gastrointestinal:  [] Blood in stool   [] Vomiting blood  [] Gastroesophageal reflux/heartburn   [] Difficulty swallowing. Genitourinary:  [] Chronic kidney disease   [] Difficult urination  [] Frequent urination  [] Burning with urination   [] Blood in urine Skin:  [] Rashes   [] Ulcers   [] Wounds Psychological:  [] History of anxiety   []  History of major depression.    Physical Examination  Vitals:   03/08/19 1843 03/08/19 1926 03/09/19 0638 03/09/19 0736  BP: (!) 134/50 (!) 115/54 130/69 139/64  Pulse: 60 (!) 59 63 63  Resp: 18 18 18    Temp: 97.7 F (36.5 C) 97.9 F (36.6 C) 98 F (36.7 C) 98.5 F (36.9 C)  TempSrc: Oral Oral Axillary Oral  SpO2: 100% 99% 98% 97%  Weight:      Height:       Body mass index is 21.16 kg/m.  Head: [place]/AT, No temporalis wasting. Prominent temp pulse not noted. Ear/Nose/Throat: Nares w/o erythema or drainage, oropharynx w/o obsrtuction, Mallampati score: Class III.   Eyes: PERRLA, Sclera nonicteric.  Neck: Supple, no nuchal rigidity.  No bruit or JVD.  Pulmonary:  Breath sounds equal bilaterally, no use of accessory muscles.  Cardiac: RRR, normal S1, S2, no Murmurs, rubs or  gallops. Vascular: Well-healed left carotid incisional scar positive carotid bruit on the left Gastrointestinal: soft, non-tender, non-distended.  Musculoskeletal: Moves all extremities.  No deformity or atrophy. No edema. Neurologic: CN 2-12 intact. Symmetrical.  Speech is fluent.  Psychiatric: Judgment intact, Mood & affect appropriate for pt's clinical situation. Dermatologic: No rashes or ulcers noted.  No cellulitis or open wounds. Lymph : No Cervical,  or Inguinal lymphadenopathy.      CBC Lab Results  Component Value Date   WBC 9.8 03/08/2019   HGB 12.8 03/08/2019   HCT 39.5 03/08/2019   MCV 94.0 03/08/2019   PLT 152 03/08/2019    BMET    Component Value Date/Time   NA 139 03/08/2019 1317   NA 139 08/04/2013 0325   K 4.2 03/08/2019 1317   K 3.9 08/04/2013 0325   CL 103 03/08/2019 1317   CL 107 08/04/2013 0325   CO2 25 03/08/2019 1317   CO2 27 08/04/2013 0325   GLUCOSE 107 (H) 03/08/2019 1317   GLUCOSE 92 08/04/2013 0325   BUN 11 03/08/2019 1317   BUN 8 08/04/2013 0325   CREATININE 0.65 03/08/2019 1317   CREATININE 0.64 08/04/2013 0325   CALCIUM 9.3 03/08/2019 1317   CALCIUM 8.8 08/04/2013 0325   GFRNONAA >60 03/08/2019 1317   GFRNONAA >60 08/04/2013 0325   GFRAA >60 03/08/2019 1317   GFRAA >60 08/04/2013 0325   Estimated Creatinine Clearance: 53.6 mL/min (by C-G formula based on SCr of 0.65 mg/dL).  COAG Lab Results  Component Value Date   INR 0.9 08/04/2013    Radiology CT angiography of the neck is reviewed by myself.  I concur with the interventional radiology report there is approximately 60% restenosis of the left internal carotid artery.  The right internal carotid artery remains widely patent as do both right and left vertebral arteries.  Given these findings it is highly unlikely that a global symptom like syncope and blackouts could be related to vascular.   Assessment/Plan 1.  Carotid stenosis, recurrence: Recommend:  Given the patient's  asymptomatic subcritical stenosis no further invasive testing or surgery at this time.  Given these findings it is highly unlikely that a global symptom like syncope and blackouts could be related to vascular.  Therefore I do not recommend any further invasive testing or surgical intervention at this time.  CT angiogram shows 60% stenosis on the left.  Continue antiplatelet therapy as prescribed Continue management of CAD, HTN and Hyperlipidemia Healthy heart diet,  encouraged exercise at least 4 times per week Follow up in 1 month in the office with physical exam I would anticipate obtaining a duplex ultrasound in 6 months  2.  Coronary artery disease continue Plavix and  3.  Hyperlipidemia continue rosuvastatin 40 mg daily   Hortencia Pilar, MD  03/09/2019 4:16 PM

## 2019-03-10 LAB — ECHOCARDIOGRAM COMPLETE
Height: 61 in
Weight: 1792 oz

## 2019-03-10 NOTE — Plan of Care (Signed)
  Problem: Education: Goal: Knowledge of General Education information will improve Description: Including pain rating scale, medication(s)/side effects and non-pharmacologic comfort measures Outcome: Progressing   Problem: Health Behavior/Discharge Planning: Goal: Ability to manage health-related needs will improve Outcome: Progressing   Problem: Pain Managment: Goal: General experience of comfort will improve Outcome: Progressing   

## 2019-03-10 NOTE — Discharge Planning (Signed)
P.t education and D/C instruction have been given to P.T

## 2019-03-10 NOTE — Discharge Summary (Signed)
[facility] [place] NAME: Alyssa Watson    MR#:  [id number]  DATE OF BIRTH:  [birth year]  DATE OF ADMISSION:  03/08/2019   ADMITTING PHYSICIAN: Dustin Flock, MD  DATE OF DISCHARGE: 03/10/19  PRIMARY CARE PHYSICIAN: System, Pcp Not In   ADMISSION DIAGNOSIS:  Syncope and collapse [R55] DISCHARGE DIAGNOSIS:  Active Problems:   Syncope and collapse  SECONDARY DIAGNOSIS:   Past Medical History:  Diagnosis Date  . Anxiety and depression   . H/O uterine prolapse 03/2014  . Heart disease    h/o angioplasty  . Hyperlipemia   . Rash of face   . Sleep apnea   . Vaginal enterocele    1st degree cystocele   HOSPITAL COURSE:   Alyssa Watson is a 63 year old female who presented to the ED with dizziness over the last 6 months.  The dizziness occurs right when she stands up.  In the ED, she was noted to have blood pressures in the in the upper 14E systolic.  She was admitted for further management.  Syncope- likely related to hypotension and orthostasis. -Orthostatic vitals negative on discharge -Echo with EF 55 to 60%, bicuspid aortic valve with mild thickening, trivial aortic valve regurgitation, small patent foramen ovale -Metoprolol was discontinued -Able to ambulate around the room on the day of discharge without any dizziness -Needs blood pressure rechecked as an outpatient  Left carotid artery stenosis history of left carotid endarterectomy -Carotid Doppler ultrasound with >70% stenosis -CTA neck with recurrent 60% stenosis -Continued aspirin, Plavix, statin -Vascular surgery consulted- will need to follow-up in their office in 1 month  Coronary artery disease- stable, no active chest pain -Continued Plavix and aspirin -Metoprolol discontinued  Hyperlipidemia-stable -Continued home Crestor  Tobacco use -Tobacco cessation counseling performed this admission  DISCHARGE CONDITIONS:  Left carotid artery stenosis CAD  Hyperlipidemia Tobacco use CONSULTS OBTAINED:  Treatment Team:  Katha Cabal, MD DRUG ALLERGIES:   Allergies  Allergen Reactions  . Codeine Rash  . Penicillins Rash    Did it involve swelling of the face/tongue/throat, SOB, or low BP? No Did it involve sudden or severe rash/hives, skin peeling, or any reaction on the inside of your mouth or nose? No Did you need to seek medical attention at a hospital or doctor's office? No When did it last happen?20 years ago If all above answers are "NO", may proceed with cephalosporin use.   DISCHARGE MEDICATIONS:   Allergies as of 03/10/2019      Reactions   Codeine Rash   Penicillins Rash   Did it involve swelling of the face/tongue/throat, SOB, or low BP? No Did it involve sudden or severe rash/hives, skin peeling, or any reaction on the inside of your mouth or nose? No Did you need to seek medical attention at a hospital or doctor's office? No When did it last happen?20 years ago If all above answers are "NO", may proceed with cephalosporin use.      Medication List    STOP taking these medications   metoprolol tartrate 25 MG tablet Commonly known as:  LOPRESSOR     TAKE these medications   ALPRAZolam 0.5 MG tablet Commonly known as:  XANAX Take 0.5 mg by mouth at bedtime as needed for anxiety.   aspirin EC 81 MG tablet Take 81 mg by mouth daily.   atorvastatin 80 MG tablet Commonly known as:  LIPITOR Take 80 mg by mouth daily.   clopidogrel 75 MG tablet  Commonly known as:  PLAVIX Take 75 mg by mouth daily.   gabapentin 100 MG capsule Commonly known as:  NEURONTIN Take 100 mg by mouth daily.   rosuvastatin 40 MG tablet Commonly known as:  CRESTOR Take 40 mg by mouth daily.   sertraline 25 MG tablet Commonly known as:  ZOLOFT Take 25 mg by mouth daily.   traZODone 50 MG tablet Commonly known as:  DESYREL Take 50 mg by mouth Nightly.   Vitamin D (Ergocalciferol) 1.25 MG (50000 UT) Caps  capsule Commonly known as:  DRISDOL Take 50,000 Units by mouth once a week.        DISCHARGE INSTRUCTIONS:  1.  Follow-up with PCP in 5 days 2.  Follow-up with vascular surgery in 1 month 3.  Stop metoprolol DIET:  Cardiac diet DISCHARGE CONDITION:  Stable ACTIVITY:  Activity as tolerated OXYGEN:  Home Oxygen: No.  Oxygen Delivery: room air DISCHARGE LOCATION:  home   If you experience worsening of your admission symptoms, develop shortness of breath, life threatening emergency, suicidal or homicidal thoughts you must seek medical attention immediately by calling 911 or calling your MD immediately  if symptoms less severe.  You Must read complete instructions/literature along with all the possible adverse reactions/side effects for all the Medicines you take and that have been prescribed to you. Take any new Medicines after you have completely understood and accpet all the possible adverse reactions/side effects.   Please note  You were cared for by a hospitalist during your hospital stay. If you have any questions about your discharge medications or the care you received while you were in the hospital after you are discharged, you can call the unit and asked to speak with the hospitalist on call if the hospitalist that took care of you is not available. Once you are discharged, your primary care physician will handle any further medical issues. Please note that NO REFILLS for any discharge medications will be authorized once you are discharged, as it is imperative that you return to your primary care physician (or establish a relationship with a primary care physician if you do not have one) for your aftercare needs so that they can reassess your need for medications and monitor your lab values.    On the day of Discharge:  VITAL SIGNS:  Blood pressure (!) 129/58, pulse (!) 55, temperature 98.4 F (36.9 C), temperature source Oral, resp. rate 20, height 5\' 1"  (1.549 m), weight  50.8 kg, SpO2 99 %. PHYSICAL EXAMINATION:  GENERAL:  63 y.o.-year-old patient lying in the bed with no acute distress.  EYES: Pupils equal, round, reactive to light and accommodation. No scleral icterus. Extraocular muscles intact.  HEENT: Head atraumatic, normocephalic. Oropharynx and nasopharynx clear.  NECK:  Supple, no jugular venous distention. No thyroid enlargement, no tenderness.  LUNGS: Normal breath sounds bilaterally, no wheezing, rales,rhonchi or crepitation. No use of accessory muscles of respiration.  CARDIOVASCULAR: RRR, S1, S2 normal. No murmurs, rubs, or gallops.  ABDOMEN: Soft, non-tender, non-distended. Bowel sounds present. No organomegaly or mass.  EXTREMITIES: No pedal edema, cyanosis, or clubbing.  NEUROLOGIC: Cranial nerves II through XII are intact. Muscle strength 5/5 in all extremities. Sensation intact. Gait not checked.  PSYCHIATRIC: The patient is alert and oriented x 3.  SKIN: No obvious rash, lesion, or ulcer.  DATA REVIEW:   CBC Recent Labs  Lab 03/08/19 1317  WBC 9.8  HGB 12.8  HCT 39.5  PLT 152    Chemistries  Recent Labs  Lab 03/08/19 1317  NA 139  K 4.2  CL 103  CO2 25  GLUCOSE 107*  BUN 11  CREATININE 0.65  CALCIUM 9.3     Microbiology Results  No results found for this or any previous visit.  RADIOLOGY:  Ct Angio Neck W Or Wo Contrast  Result Date: 03/09/2019 CLINICAL DATA:  Left carotid endarterectomy 2014. Recurrent left carotid stenosis by ultrasound EXAM: CT ANGIOGRAPHY NECK TECHNIQUE: Multidetector CT imaging of the neck was performed using the standard protocol during bolus administration of intravenous contrast. Multiplanar CT image reconstructions and MIPs were obtained to evaluate the vascular anatomy. Carotid stenosis measurements (when applicable) are obtained utilizing NASCET criteria, using the distal internal carotid diameter as the denominator. CONTRAST:  60mL OMNIPAQUE IOHEXOL 350 MG/ML SOLN COMPARISON:  Carotid  Doppler 03/09/2019 FINDINGS: Aortic arch: Mild atherosclerotic disease in the thoracic aorta without aneurysm or dissection. Proximal great vessels widely patent with mild atherosclerotic disease. Right carotid system: Mild atherosclerotic calcification right carotid bifurcation and internal carotid artery without significant stenosis Left carotid system: Postop left carotid endarterectomy. No vascular calcification. Irregularity and narrowing of the proximal left internal carotid artery. Minimal luminal diameter 1.8 mm corresponding to 60% diameter stenosis. No dissection or aneurysm. Vertebral arteries: Both vertebral arteries widely patent without significant stenosis. Skeleton: Cervical disc degeneration without focal bony abnormality Other neck: 10 mm gas collection posterolateral to the trachea at the level of the thyroid. This appears to communicate with the trachea and may represent a diverticulum. No surrounding inflammation. Upper chest: Apical emphysema IMPRESSION: 1. Postop left carotid endarterectomy. Recurrent stenosis measuring 60% diameter stenosis left internal carotid artery. No aneurysm identified. 2. Atherosclerotic disease right carotid bifurcation without significant stenosis 3. 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 Electronically Signed   By: Franchot Gallo M.D.   On: 03/09/2019 15:47     Management plans discussed with the patient, family and they are in agreement.  CODE STATUS: Full Code   TOTAL TIME TAKING CARE OF THIS PATIENT: 45 minutes.    Alyssa Watson M.D on 03/10/2019 at 11:03 AM  Between 7am to 6pm - Pager - [phone]  After 6pm go to [email or web address] - Proofreader  [facility]  Office  [phone]  CC: Primary care physician; System, Pcp Not In   Note: This dictation was prepared with Dragon dictation along with smaller phrase technology. Any transcriptional errors that result from this process are  unintentional.

## 2019-03-10 NOTE — Discharge Instructions (Signed)
It was so nice to meet you during this hospitalization!  You came into the hospital with dizziness and passing out. We think this may have been caused by the metoprolol. Please STOP taking this medication at home. Your blood pressure has looked fine.  You were found to have a 60% blockage in your left carotid artery. Please make sure you follow-up with the vascular surgeon in 1 month.  Take care, Dr. Brett Albino

## 2019-04-19 ENCOUNTER — Encounter (INDEPENDENT_AMBULATORY_CARE_PROVIDER_SITE_OTHER): Payer: Self-pay | Admitting: Vascular Surgery

## 2019-04-19 ENCOUNTER — Ambulatory Visit (INDEPENDENT_AMBULATORY_CARE_PROVIDER_SITE_OTHER): Payer: BLUE CROSS/BLUE SHIELD | Admitting: Vascular Surgery

## 2019-04-19 ENCOUNTER — Telehealth (INDEPENDENT_AMBULATORY_CARE_PROVIDER_SITE_OTHER): Payer: Self-pay | Admitting: Vascular Surgery

## 2019-04-19 ENCOUNTER — Other Ambulatory Visit: Payer: Self-pay

## 2019-04-19 VITALS — BP 145/78 | HR 80 | Resp 12 | Ht 61.0 in | Wt 113.0 lb

## 2019-04-19 DIAGNOSIS — Z7982 Long term (current) use of aspirin: Secondary | ICD-10-CM

## 2019-04-19 DIAGNOSIS — G479 Sleep disorder, unspecified: Secondary | ICD-10-CM | POA: Diagnosis not present

## 2019-04-19 DIAGNOSIS — I6523 Occlusion and stenosis of bilateral carotid arteries: Secondary | ICD-10-CM

## 2019-04-19 MED ORDER — ALPRAZOLAM 0.5 MG PO TABS: 0.5000 mg | ORAL_TABLET | Freq: Every evening | ORAL | 0 refills | Status: DC | PRN

## 2019-04-19 NOTE — Progress Notes (Signed)
MRN : [id number]  ICHELLE Watson is a 63 y.o. ([birth year]) female who presents with chief complaint of  Chief Complaint  Patient presents with  . Follow-up    History of Present Illness:   The patient is seen for follow up evaluation of carotid stenosis. The carotid stenosis followed by ultrasound.   No further episodes of blacking out since DC from the hospital.  Her BP medications have been adjusted.  The patient denies amaurosis fugax. There is no recent history of TIA symptoms or focal motor deficits. There is no prior documented CVA.  The patient is taking enteric-coated aspirin 81 mg daily.  There is no history of migraine headaches. There is no history of seizures.  She continues to c/o insomnia and has tried Clinical biochemist with no effect.  She has used 0.5 mg of Xanax in the past with excellent result  The patient has a history of coronary artery disease, no recent episodes of angina or shortness of breath. The patient denies PAD or claudication symptoms. There is a history of hyperlipidemia which is being treated with a statin.     Current Meds  Medication Sig  . aspirin EC 81 MG tablet Take 81 mg by mouth daily.  Marland Kitchen atorvastatin (LIPITOR) 80 MG tablet Take 80 mg by mouth daily.  . cetirizine (ZYRTEC) 10 MG tablet Take 10 mg by mouth daily.  . clopidogrel (PLAVIX) 75 MG tablet Take 75 mg by mouth daily.  Marland Kitchen gabapentin (NEURONTIN) 100 MG capsule Take 100 mg by mouth daily.  Marland Kitchen olopatadine (PATANOL) 0.1 % ophthalmic solution INSTILL 1 DROP INTO EACH AFFECTED EYE TWICE DAILY  . rosuvastatin (CRESTOR) 40 MG tablet Take 40 mg by mouth daily.  . sertraline (ZOLOFT) 25 MG tablet Take 100 mg by mouth daily.   . Vitamin D, Ergocalciferol, (DRISDOL) 1.25 MG (50000 UT) CAPS capsule Take 50,000 Units by mouth once a week.    Past Medical History:  Diagnosis Date  . Anxiety and depression   . H/O uterine prolapse 03/2014  . Heart disease    h/o angioplasty  .  Hyperlipemia   . Rash of face   . Sleep apnea   . Vaginal enterocele    1st degree cystocele    Past Surgical History:  Procedure Laterality Date  . ANGIOPLASTY  05/2013   stent placed  . CARPAL TUNNEL RELEASE Left   . VAGINAL HYSTERECTOMY  03/2014   with BSO    Social History Social History   Tobacco Use  . Smoking status: Current Every Day Smoker  . Smokeless tobacco: Current User  Substance Use Topics  . Alcohol use: No    Alcohol/week: 0.0 standard drinks  . Drug use: No    Family History No family history on file.  Allergies  Allergen Reactions  . Codeine Rash  . Penicillins Rash    Did it involve swelling of the face/tongue/throat, SOB, or low BP? No Did it involve sudden or severe rash/hives, skin peeling, or any reaction on the inside of your mouth or nose? No Did you need to seek medical attention at a hospital or doctor's office? No When did it last happen?20 years ago If all above answers are "NO", may proceed with cephalosporin use.     REVIEW OF SYSTEMS (Negative unless checked)  Constitutional: [] Weight loss  [] Fever  [] Chills Cardiac: [] Chest pain   [] Chest pressure   [] Palpitations   [] Shortness of breath when laying flat   [] Shortness of  breath with exertion. Vascular:  [] Pain in legs with walking   [] Pain in legs at rest  [] History of DVT   [] Phlebitis   [] Swelling in legs   [] Varicose veins   [] Non-healing ulcers Pulmonary:   [] Uses home oxygen   [] Productive cough   [] Hemoptysis   [] Wheeze  [] COPD   [] Asthma Neurologic:  [] Dizziness   [] Seizures   [] History of stroke   [] History of TIA  [] Aphasia   [] Vissual changes   [] Weakness or numbness in arm   [] Weakness or numbness in leg Musculoskeletal:   [] Joint swelling   [] Joint pain   [] Low back pain Hematologic:  [] Easy bruising  [] Easy bleeding   [] Hypercoagulable state   [] Anemic Gastrointestinal:  [] Diarrhea   [] Vomiting  [] Gastroesophageal reflux/heartburn   [] Difficulty swallowing.  Genitourinary:  [] Chronic kidney disease   [] Difficult urination  [] Frequent urination   [] Blood in urine Skin:  [] Rashes   [] Ulcers  Psychological:  [] History of anxiety   []  History of major depression.  Physical Examination  Vitals:   04/19/19 1004  BP: (!) 145/78  Pulse: 80  Resp: 12  Weight: 113 lb (51.3 kg)  Height: 5\' 1"  (1.549 m)   Body mass index is 21.35 kg/m. Gen: WD/WN, NAD Head: [place]/AT, No temporalis wasting.  Ear/Nose/Throat: Hearing grossly intact, nares w/o erythema or drainage Eyes: PER, EOMI, sclera nonicteric.  Neck: Supple, no large masses.   Pulmonary:  Good air movement, no audible wheezing bilaterally, no use of accessory muscles.  Cardiac: RRR, no JVD Vascular:  Left carotid bruit Vessel Right Left  Radial Palpable Palpable  Brachial Palpable Palpable  Carotid Palpable Palpable  Gastrointestinal: Non-distended. No guarding/no peritoneal signs.  Musculoskeletal: M/S 5/5 throughout.  No deformity or atrophy.  Neurologic: CN 2-12 intact. Symmetrical.  Speech is fluent. Motor exam as listed above. Psychiatric: Judgment intact, Mood & affect appropriate for pt's clinical situation. Dermatologic: No rashes or ulcers noted.  No changes consistent with cellulitis. Lymph : No lichenification or skin changes of chronic lymphedema.  CBC Lab Results  Component Value Date   WBC 9.8 03/08/2019   HGB 12.8 03/08/2019   HCT 39.5 03/08/2019   MCV 94.0 03/08/2019   PLT 152 03/08/2019    BMET    Component Value Date/Time   NA 139 03/08/2019 1317   NA 139 08/04/2013 0325   K 4.2 03/08/2019 1317   K 3.9 08/04/2013 0325   CL 103 03/08/2019 1317   CL 107 08/04/2013 0325   CO2 25 03/08/2019 1317   CO2 27 08/04/2013 0325   GLUCOSE 107 (H) 03/08/2019 1317   GLUCOSE 92 08/04/2013 0325   BUN 11 03/08/2019 1317   BUN 8 08/04/2013 0325   CREATININE 0.65 03/08/2019 1317   CREATININE 0.64 08/04/2013 0325   CALCIUM 9.3 03/08/2019 1317   CALCIUM 8.8 08/04/2013 0325    GFRNONAA >60 03/08/2019 1317   GFRNONAA >60 08/04/2013 0325   GFRAA >60 03/08/2019 1317   GFRAA >60 08/04/2013 0325   CrCl cannot be calculated (Patient's most recent lab result is older than the maximum 21 days allowed.).  COAG Lab Results  Component Value Date   INR 0.9 08/04/2013    Radiology No results found.   Assessment/Plan 1. Bilateral carotid artery stenosis Recommend:  Given the patient's asymptomatic subcritical stenosis no further invasive testing or surgery at this time.  Duplex ultrasound shows <74% RICA and 25% LICA.  Continue antiplatelet therapy as prescribed Continue management of CAD, HTN and Hyperlipidemia Healthy heart diet,  encouraged exercise at least 4 times per week Follow up in 6 months with duplex ultrasound and physical exam   - VAS US CAROTID; Future  2. Sleep disorder I will give her one Rx for Xanax while she is trying to establish with a primary care MD.  I have informed her that I will not be the provider that represcribes this medication routinely   Hortencia Pilar, MD  04/19/2019 10:17 AM

## 2019-04-19 NOTE — Telephone Encounter (Signed)
Rebecca-pharmacist at [facility] calling stating that we had called in Xanax 0.5mg  and the patient is already taking Clonazepam. She said this was an inappropriate order-the patient would be on 2 different benzos. I spoke with Dr. Ronalee Belts and he advised that he had told patient to DC clonazepam bc it wasn't working and told her to start Xanax.  I called back to [place] and spoke with Robin. I advised her of the above and she said she was cancel clonazepam in her system.   I spoke with the patient, she is aware to only take the xanax and to dc clonazepam. Patient verbalized understanding. AS, CMA

## 2019-04-25 DIAGNOSIS — I10 Essential (primary) hypertension: Secondary | ICD-10-CM | POA: Insufficient documentation

## 2019-04-25 DIAGNOSIS — G4733 Obstructive sleep apnea (adult) (pediatric): Secondary | ICD-10-CM | POA: Insufficient documentation

## 2019-04-25 DIAGNOSIS — I6523 Occlusion and stenosis of bilateral carotid arteries: Secondary | ICD-10-CM | POA: Insufficient documentation

## 2019-04-25 DIAGNOSIS — E7800 Pure hypercholesterolemia, unspecified: Secondary | ICD-10-CM | POA: Insufficient documentation

## 2019-04-25 DIAGNOSIS — G4701 Insomnia due to medical condition: Secondary | ICD-10-CM | POA: Insufficient documentation

## 2019-08-10 DIAGNOSIS — Q2112 Patent foramen ovale: Secondary | ICD-10-CM | POA: Insufficient documentation

## 2019-08-10 DIAGNOSIS — Q211 Atrial septal defect: Secondary | ICD-10-CM | POA: Insufficient documentation

## 2019-08-12 DIAGNOSIS — Q231 Congenital insufficiency of aortic valve: Secondary | ICD-10-CM | POA: Insufficient documentation

## 2019-08-12 DIAGNOSIS — Q2381 Bicuspid aortic valve: Secondary | ICD-10-CM | POA: Insufficient documentation

## 2019-10-04 DIAGNOSIS — G44229 Chronic tension-type headache, not intractable: Secondary | ICD-10-CM | POA: Diagnosis not present

## 2019-10-22 ENCOUNTER — Ambulatory Visit (INDEPENDENT_AMBULATORY_CARE_PROVIDER_SITE_OTHER): Payer: BC Managed Care – PPO | Admitting: Vascular Surgery

## 2019-10-22 ENCOUNTER — Encounter (INDEPENDENT_AMBULATORY_CARE_PROVIDER_SITE_OTHER): Payer: BC Managed Care – PPO

## 2020-01-12 ENCOUNTER — Encounter: Payer: Self-pay | Admitting: Emergency Medicine

## 2020-01-12 ENCOUNTER — Emergency Department
Admission: EM | Admit: 2020-01-12 | Discharge: 2020-01-12 | Disposition: A | Discharge status: Home / Self Care | Payer: BC Managed Care – PPO | Attending: Emergency Medicine | Admitting: Emergency Medicine

## 2020-01-12 ENCOUNTER — Other Ambulatory Visit: Payer: Self-pay

## 2020-01-12 DIAGNOSIS — Z5321 Procedure and treatment not carried out due to patient leaving prior to being seen by health care provider: Secondary | ICD-10-CM | POA: Insufficient documentation

## 2020-01-12 DIAGNOSIS — R0789 Other chest pain: Secondary | ICD-10-CM | POA: Diagnosis present

## 2020-01-12 DIAGNOSIS — R42 Dizziness and giddiness: Secondary | ICD-10-CM | POA: Diagnosis not present

## 2020-01-12 HISTORY — DX: Other specified postprocedural states: Z98.890

## 2020-01-12 LAB — BASIC METABOLIC PANEL
Anion gap: 8 (ref 5–15)
BUN: 13 mg/dL (ref 8–23)
CO2: 25 mmol/L (ref 22–32)
Calcium: 9.1 mg/dL (ref 8.9–10.3)
Chloride: 106 mmol/L (ref 98–111)
Creatinine, Ser: 0.67 mg/dL (ref 0.44–1.00)
GFR calc Af Amer: >60 mL/min (ref >60)
GFR calc non Af Amer: >60 mL/min (ref >60)
Glucose, Bld: 93 mg/dL (ref 70–99)
Potassium: 3.5 mmol/L (ref 3.5–5.1)
Sodium: 139 mmol/L (ref 135–145)

## 2020-01-12 LAB — URINALYSIS, COMPLETE (UACMP) WITH MICROSCOPIC
Bacteria, UA: NONE SEEN
Bilirubin Urine: NEGATIVE
Glucose, UA: NEGATIVE mg/dL
Hgb urine dipstick: NEGATIVE
Ketones, ur: NEGATIVE mg/dL
Leukocytes,Ua: NEGATIVE
Nitrite: NEGATIVE
Protein, ur: 30 mg/dL — AB
Specific Gravity, Urine: 1.019 (ref 1.005–1.030)
pH: 7 (ref 5.0–8.0)

## 2020-01-12 LAB — CBC
HCT: 35.7 % — ABNORMAL LOW (ref 36.0–46.0)
Hemoglobin: 10.9 g/dL — ABNORMAL LOW (ref 12.0–15.0)
MCH: 26.4 pg (ref 26.0–34.0)
MCHC: 30.5 g/dL (ref 30.0–36.0)
MCV: 86.4 fL (ref 80.0–100.0)
Platelets: 292 10*3/uL (ref 150–400)
RBC: 4.13 MIL/uL (ref 3.87–5.11)
RDW: 15.3 % (ref 11.5–15.5)
WBC: 8.3 10*3/uL (ref 4.0–10.5)
nRBC: 0 % (ref 0.0–0.2)

## 2020-01-12 LAB — TROPONIN I (HIGH SENSITIVITY): Troponin I (High Sensitivity): 3 ng/L (ref <18)

## 2020-01-12 NOTE — ED Notes (Signed)
Pt to desk to inquire about wait time, pt states that she is going to leave states to this RN, "well if I was having a heart attack they wouldn't have left me in the lobby". This RN repeatedly encouraged patient to stay and explained that staff working to have patient seen as quickly as possibly. Pt states "I'm just going to follow up with my primary care on Monday". This RN once again encouraged patient to stay and be seen by EDP, pt ambulatory out of the lobby without difficulty.

## 2020-01-12 NOTE — ED Triage Notes (Signed)
Pt has had pain to both right and left chest for past 4 days described as dull.  Similar pain in past and was admitted for it and taken off her bp emds.  Today felt like was going to pass out while at work.  Denies any SHOB.  Still having pain at this time.

## 2020-01-12 NOTE — ED Notes (Signed)
First Nurse Note: Pt ambulated to restroom without difficulty or distress

## 2020-01-12 NOTE — ED Notes (Signed)
First Nurse Note: Pt to ED via POV c/o chest pain and dizziness. Pt is in NAD.

## 2020-01-14 ENCOUNTER — Telehealth: Payer: Self-pay | Admitting: Emergency Medicine

## 2020-01-14 DIAGNOSIS — M25512 Pain in left shoulder: Secondary | ICD-10-CM | POA: Diagnosis not present

## 2020-01-14 DIAGNOSIS — M25511 Pain in right shoulder: Secondary | ICD-10-CM | POA: Diagnosis not present

## 2020-01-14 DIAGNOSIS — D649 Anemia, unspecified: Secondary | ICD-10-CM | POA: Diagnosis not present

## 2020-01-14 DIAGNOSIS — M19011 Primary osteoarthritis, right shoulder: Secondary | ICD-10-CM | POA: Diagnosis not present

## 2020-01-14 NOTE — Telephone Encounter (Signed)
Called patient due to lwot to inquire about condition and follow up plans.left message

## 2020-01-16 DIAGNOSIS — E538 Deficiency of other specified B group vitamins: Secondary | ICD-10-CM | POA: Diagnosis not present

## 2020-02-26 DIAGNOSIS — R350 Frequency of micturition: Secondary | ICD-10-CM | POA: Diagnosis not present

## 2020-02-26 DIAGNOSIS — F17200 Nicotine dependence, unspecified, uncomplicated: Secondary | ICD-10-CM | POA: Diagnosis not present

## 2020-02-26 DIAGNOSIS — R1030 Lower abdominal pain, unspecified: Secondary | ICD-10-CM | POA: Diagnosis not present

## 2020-02-26 DIAGNOSIS — R1032 Left lower quadrant pain: Secondary | ICD-10-CM | POA: Diagnosis not present

## 2020-02-26 DIAGNOSIS — E538 Deficiency of other specified B group vitamins: Secondary | ICD-10-CM | POA: Diagnosis not present

## 2020-03-20 ENCOUNTER — Other Ambulatory Visit (HOSPITAL_COMMUNITY): Payer: Self-pay | Admitting: Family Medicine

## 2020-03-20 ENCOUNTER — Ambulatory Visit
Admission: RE | Admit: 2020-03-20 | Discharge: 2020-03-20 | Disposition: A | Discharge status: Home / Self Care | Payer: Medicare HMO | Source: Ambulatory Visit | Attending: Family Medicine | Admitting: Family Medicine

## 2020-03-20 ENCOUNTER — Other Ambulatory Visit: Payer: Self-pay

## 2020-03-20 ENCOUNTER — Other Ambulatory Visit: Payer: Self-pay | Admitting: Family Medicine

## 2020-03-20 DIAGNOSIS — R10829 Rebound abdominal tenderness, unspecified site: Secondary | ICD-10-CM

## 2020-03-20 DIAGNOSIS — R109 Unspecified abdominal pain: Secondary | ICD-10-CM | POA: Diagnosis not present

## 2020-03-20 DIAGNOSIS — E538 Deficiency of other specified B group vitamins: Secondary | ICD-10-CM | POA: Diagnosis not present

## 2020-03-20 DIAGNOSIS — R1031 Right lower quadrant pain: Secondary | ICD-10-CM

## 2020-03-20 DIAGNOSIS — F17210 Nicotine dependence, cigarettes, uncomplicated: Secondary | ICD-10-CM | POA: Diagnosis not present

## 2020-03-20 DIAGNOSIS — K219 Gastro-esophageal reflux disease without esophagitis: Secondary | ICD-10-CM | POA: Diagnosis not present

## 2020-03-20 MED ORDER — IOHEXOL 300 MG/ML  SOLN
75.0000 mL | Freq: Once | INTRAMUSCULAR | Status: AC | PRN
  Administered 2020-03-20: 75 mL via INTRAVENOUS

## 2020-03-24 DIAGNOSIS — I313 Pericardial effusion (noninflammatory): Secondary | ICD-10-CM | POA: Diagnosis not present

## 2020-03-27 DIAGNOSIS — I10 Essential (primary) hypertension: Secondary | ICD-10-CM | POA: Diagnosis not present

## 2020-03-27 DIAGNOSIS — E785 Hyperlipidemia, unspecified: Secondary | ICD-10-CM | POA: Diagnosis not present

## 2020-03-27 DIAGNOSIS — Z0000 Encounter for general adult medical examination without abnormal findings: Secondary | ICD-10-CM | POA: Diagnosis not present

## 2020-03-27 DIAGNOSIS — R739 Hyperglycemia, unspecified: Secondary | ICD-10-CM | POA: Diagnosis not present

## 2020-03-27 DIAGNOSIS — I2510 Atherosclerotic heart disease of native coronary artery without angina pectoris: Secondary | ICD-10-CM | POA: Diagnosis not present

## 2020-03-27 DIAGNOSIS — I517 Cardiomegaly: Secondary | ICD-10-CM | POA: Diagnosis not present

## 2020-03-27 DIAGNOSIS — Z720 Tobacco use: Secondary | ICD-10-CM | POA: Diagnosis not present

## 2020-03-27 DIAGNOSIS — F17210 Nicotine dependence, cigarettes, uncomplicated: Secondary | ICD-10-CM | POA: Diagnosis not present

## 2020-03-27 DIAGNOSIS — F419 Anxiety disorder, unspecified: Secondary | ICD-10-CM | POA: Diagnosis not present

## 2020-05-02 DIAGNOSIS — E538 Deficiency of other specified B group vitamins: Secondary | ICD-10-CM | POA: Diagnosis not present

## 2020-05-02 DIAGNOSIS — Q211 Atrial septal defect: Secondary | ICD-10-CM | POA: Diagnosis not present

## 2020-05-02 DIAGNOSIS — Q231 Congenital insufficiency of aortic valve: Secondary | ICD-10-CM | POA: Diagnosis not present

## 2020-05-02 DIAGNOSIS — I10 Essential (primary) hypertension: Secondary | ICD-10-CM | POA: Diagnosis not present

## 2020-05-02 DIAGNOSIS — I6523 Occlusion and stenosis of bilateral carotid arteries: Secondary | ICD-10-CM | POA: Diagnosis not present

## 2020-05-02 DIAGNOSIS — G4733 Obstructive sleep apnea (adult) (pediatric): Secondary | ICD-10-CM | POA: Diagnosis not present

## 2020-05-02 DIAGNOSIS — R55 Syncope and collapse: Secondary | ICD-10-CM | POA: Diagnosis not present

## 2020-05-02 DIAGNOSIS — I2510 Atherosclerotic heart disease of native coronary artery without angina pectoris: Secondary | ICD-10-CM | POA: Diagnosis not present

## 2020-05-21 ENCOUNTER — Other Ambulatory Visit: Payer: Self-pay

## 2020-05-21 ENCOUNTER — Encounter: Payer: Self-pay | Admitting: Emergency Medicine

## 2020-05-21 ENCOUNTER — Emergency Department: Payer: Medicare HMO

## 2020-05-21 ENCOUNTER — Emergency Department
Admission: EM | Admit: 2020-05-21 | Discharge: 2020-05-21 | Disposition: A | Discharge status: Home / Self Care | Payer: Medicare HMO | Attending: Emergency Medicine | Admitting: Emergency Medicine

## 2020-05-21 DIAGNOSIS — M546 Pain in thoracic spine: Secondary | ICD-10-CM | POA: Insufficient documentation

## 2020-05-21 DIAGNOSIS — Z79899 Other long term (current) drug therapy: Secondary | ICD-10-CM | POA: Diagnosis not present

## 2020-05-21 DIAGNOSIS — R918 Other nonspecific abnormal finding of lung field: Secondary | ICD-10-CM | POA: Diagnosis not present

## 2020-05-21 DIAGNOSIS — I700 Atherosclerosis of aorta: Secondary | ICD-10-CM | POA: Diagnosis not present

## 2020-05-21 DIAGNOSIS — R21 Rash and other nonspecific skin eruption: Secondary | ICD-10-CM | POA: Diagnosis not present

## 2020-05-21 DIAGNOSIS — Z5321 Procedure and treatment not carried out due to patient leaving prior to being seen by health care provider: Secondary | ICD-10-CM | POA: Insufficient documentation

## 2020-05-21 DIAGNOSIS — M549 Dorsalgia, unspecified: Secondary | ICD-10-CM | POA: Diagnosis not present

## 2020-05-21 LAB — COMPREHENSIVE METABOLIC PANEL
ALT: 15 U/L (ref 0–44)
AST: 24 U/L (ref 15–41)
Albumin: 3.8 g/dL (ref 3.5–5.0)
Alkaline Phosphatase: 80 U/L (ref 38–126)
Anion gap: 6 (ref 5–15)
BUN: 9 mg/dL (ref 8–23)
CO2: 26 mmol/L (ref 22–32)
Calcium: 9.3 mg/dL (ref 8.9–10.3)
Chloride: 108 mmol/L (ref 98–111)
Creatinine, Ser: 0.7 mg/dL (ref 0.44–1.00)
GFR calc Af Amer: >60 mL/min (ref >60)
GFR calc non Af Amer: >60 mL/min (ref >60)
Glucose, Bld: 96 mg/dL (ref 70–99)
Potassium: 3.7 mmol/L (ref 3.5–5.1)
Sodium: 140 mmol/L (ref 135–145)
Total Bilirubin: 0.5 mg/dL (ref 0.3–1.2)
Total Protein: 6.9 g/dL (ref 6.5–8.1)

## 2020-05-21 LAB — CBC WITH DIFFERENTIAL/PLATELET
Abs Immature Granulocytes: 0.01 10*3/uL (ref 0.00–0.07)
Basophils Absolute: 0.1 10*3/uL (ref 0.0–0.1)
Basophils Relative: 1 %
Eosinophils Absolute: 0.3 10*3/uL (ref 0.0–0.5)
Eosinophils Relative: 6 %
HCT: 40.7 % (ref 36.0–46.0)
Hemoglobin: 13.9 g/dL (ref 12.0–15.0)
Immature Granulocytes: 0 %
Lymphocytes Relative: 28 %
Lymphs Abs: 1.3 10*3/uL (ref 0.7–4.0)
MCH: 30.8 pg (ref 26.0–34.0)
MCHC: 34.2 g/dL (ref 30.0–36.0)
MCV: 90 fL (ref 80.0–100.0)
Monocytes Absolute: 0.5 10*3/uL (ref 0.1–1.0)
Monocytes Relative: 11 %
Neutro Abs: 2.5 10*3/uL (ref 1.7–7.7)
Neutrophils Relative %: 54 %
Platelets: 201 10*3/uL (ref 150–400)
RBC: 4.52 MIL/uL (ref 3.87–5.11)
RDW: 13.5 % (ref 11.5–15.5)
WBC: 4.6 10*3/uL (ref 4.0–10.5)
nRBC: 0 % (ref 0.0–0.2)

## 2020-05-21 LAB — TROPONIN I (HIGH SENSITIVITY): Troponin I (High Sensitivity): 5 ng/L (ref <18)

## 2020-05-21 NOTE — ED Triage Notes (Signed)
Patient ambulatory to triage with steady gait, without difficulty or distress noted; pt reports since Monday began having upper back pain radiating into mid back and left side of breast; denies any known injuries, denies hx of same; denies any accomp symptoms

## 2020-06-13 DIAGNOSIS — J309 Allergic rhinitis, unspecified: Secondary | ICD-10-CM | POA: Diagnosis not present

## 2020-06-13 DIAGNOSIS — E538 Deficiency of other specified B group vitamins: Secondary | ICD-10-CM | POA: Diagnosis not present

## 2020-06-27 DIAGNOSIS — I10 Essential (primary) hypertension: Secondary | ICD-10-CM | POA: Diagnosis not present

## 2020-06-27 DIAGNOSIS — R7309 Other abnormal glucose: Secondary | ICD-10-CM | POA: Diagnosis not present

## 2020-06-27 DIAGNOSIS — Z79899 Other long term (current) drug therapy: Secondary | ICD-10-CM | POA: Diagnosis not present

## 2020-06-27 DIAGNOSIS — D649 Anemia, unspecified: Secondary | ICD-10-CM | POA: Diagnosis not present

## 2020-06-27 DIAGNOSIS — E7800 Pure hypercholesterolemia, unspecified: Secondary | ICD-10-CM | POA: Diagnosis not present

## 2020-06-27 DIAGNOSIS — R8290 Unspecified abnormal findings in urine: Secondary | ICD-10-CM | POA: Diagnosis not present

## 2020-06-27 DIAGNOSIS — E538 Deficiency of other specified B group vitamins: Secondary | ICD-10-CM | POA: Diagnosis not present

## 2020-07-18 DIAGNOSIS — E538 Deficiency of other specified B group vitamins: Secondary | ICD-10-CM | POA: Diagnosis not present

## 2020-07-31 DIAGNOSIS — I2510 Atherosclerotic heart disease of native coronary artery without angina pectoris: Secondary | ICD-10-CM | POA: Diagnosis not present

## 2020-07-31 DIAGNOSIS — E7800 Pure hypercholesterolemia, unspecified: Secondary | ICD-10-CM | POA: Diagnosis not present

## 2020-07-31 DIAGNOSIS — Z1231 Encounter for screening mammogram for malignant neoplasm of breast: Secondary | ICD-10-CM | POA: Diagnosis not present

## 2020-07-31 DIAGNOSIS — Z1211 Encounter for screening for malignant neoplasm of colon: Secondary | ICD-10-CM | POA: Diagnosis not present

## 2020-07-31 DIAGNOSIS — I10 Essential (primary) hypertension: Secondary | ICD-10-CM | POA: Diagnosis not present

## 2020-07-31 DIAGNOSIS — Z720 Tobacco use: Secondary | ICD-10-CM | POA: Diagnosis not present

## 2020-08-01 ENCOUNTER — Other Ambulatory Visit: Payer: Self-pay | Admitting: Internal Medicine

## 2020-08-01 DIAGNOSIS — Z1231 Encounter for screening mammogram for malignant neoplasm of breast: Secondary | ICD-10-CM

## 2020-08-28 DIAGNOSIS — E538 Deficiency of other specified B group vitamins: Secondary | ICD-10-CM | POA: Diagnosis not present

## 2020-08-31 IMAGING — CT CT ANGIOGRAPHY NECK
2 of 7 series · 8 of 33 positions shown · IV contrast (APPLIED)
Comparison: Carotid Doppler 03/09/2019

CLINICAL DATA: Left carotid endarterectomy 0309. Recurrent left
carotid stenosis by ultrasound

EXAM:
CT ANGIOGRAPHY NECK
TECHNIQUE: Multidetector CT imaging of the neck was performed using the
standard protocol during bolus administration of intravenous
contrast. Multiplanar CT image reconstructions and MIPs were
obtained to evaluate the vascular anatomy. Carotid stenosis
measurements (when applicable) are obtained utilizing NASCET
criteria, using the distal internal carotid diameter as the
denominator.
CONTRAST:  75mL OMNIPAQUE IOHEXOL 350 MG/ML SOLN

[Series 4: cta neck · axial · 0.44mm/px · z∈[-222,-146]mm · 2 of 114 slices shown]
[im 38/114  soft-tissue]
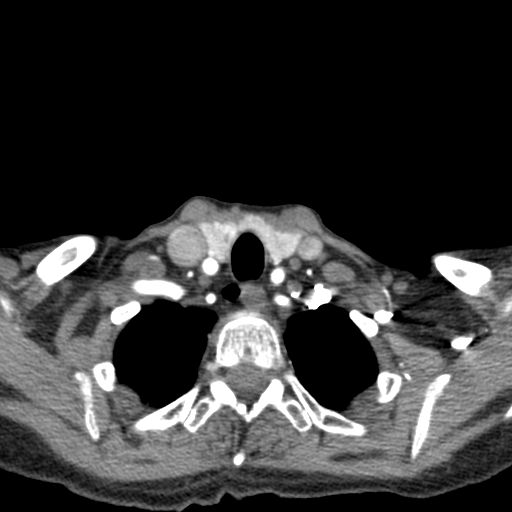
[im 76/114  soft-tissue]
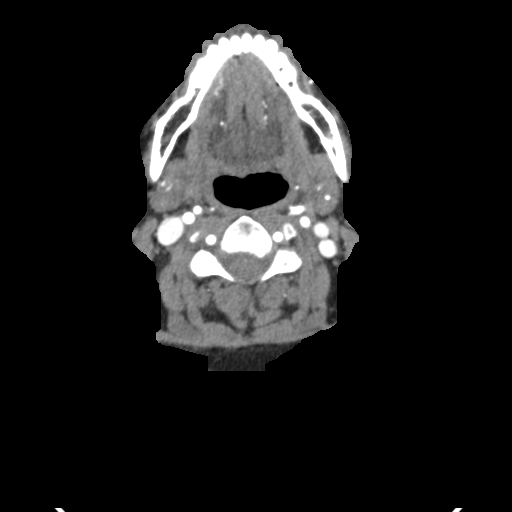

[Series 6: ax thin · axial · 0.37mm/px · z∈[-263,-103]mm · 6 of 222 slices shown]
[im 32/222  soft-tissue]
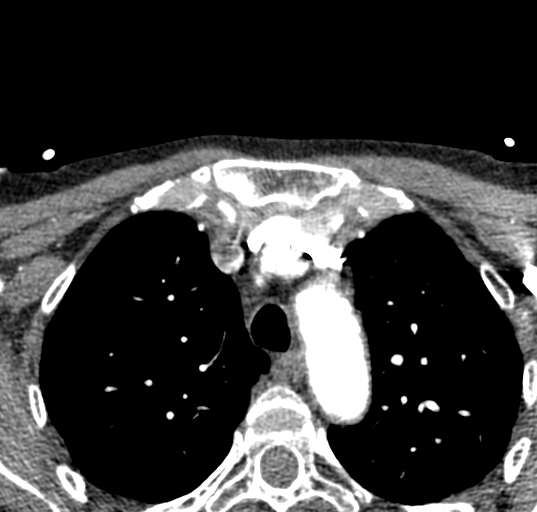
[im 64/222  bone]
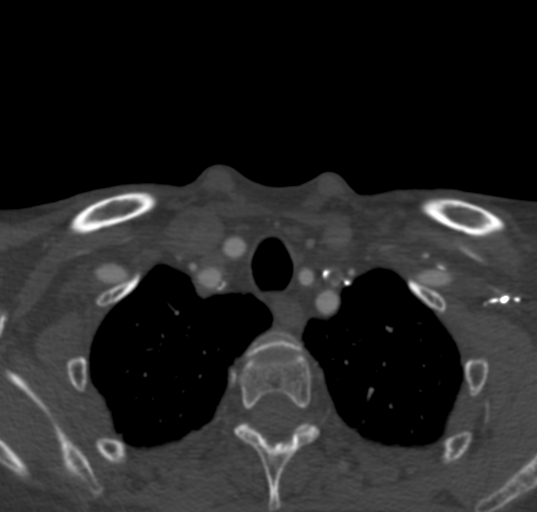
[im 95/222  soft-tissue]
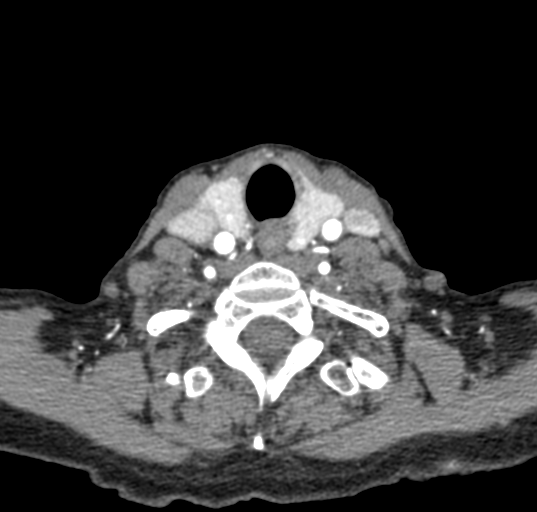
[im 127/222  bone]
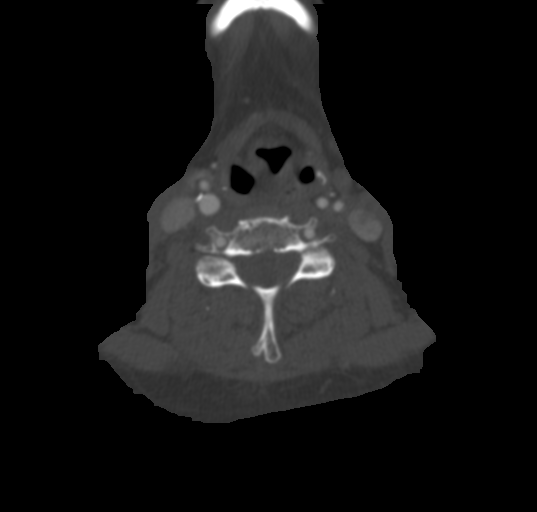
[im 158/222  soft-tissue]
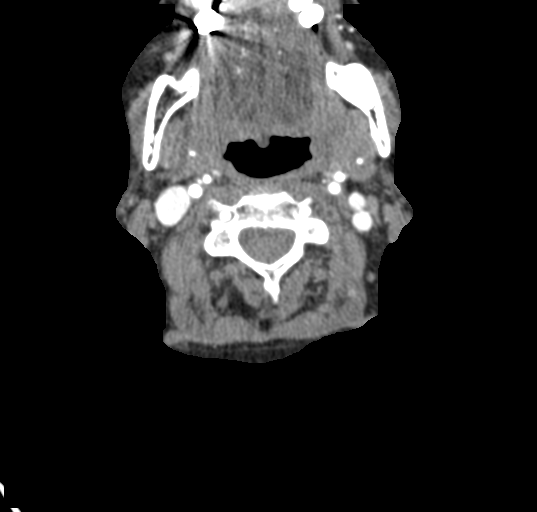
[im 190/222  bone]
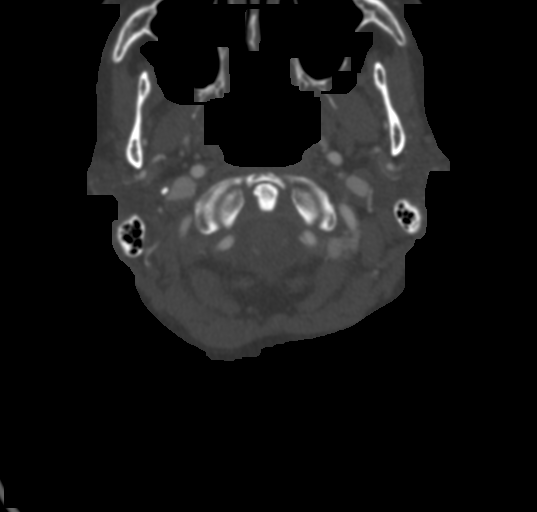

[8 of 33 positions shown; findings below may reference images not displayed]

FINDINGS: Aortic arch: Mild atherosclerotic disease in the thoracic aorta
without aneurysm or dissection. Proximal great vessels widely patent
with mild atherosclerotic disease.

Right carotid system: Mild atherosclerotic calcification right
carotid bifurcation and internal carotid artery without significant
stenosis

Left carotid system: Postop left carotid endarterectomy. No vascular
calcification. Irregularity and narrowing of the proximal left
internal carotid artery. Minimal luminal diameter 1.8 mm
corresponding to 60% diameter stenosis. No dissection or aneurysm.

Vertebral arteries: Both vertebral arteries widely patent without
significant stenosis.

Skeleton: Cervical disc degeneration without focal bony abnormality

Other neck: 10 mm gas collection posterolateral to the trachea at
the level of the thyroid. This appears to communicate with the
trachea and may represent a diverticulum. No surrounding
inflammation.

Upper chest: Apical emphysema
IMPRESSION: 1. Postop left carotid endarterectomy. Recurrent stenosis measuring
60% diameter stenosis left internal carotid artery. No aneurysm
identified.
2. Atherosclerotic disease right carotid bifurcation without
significant stenosis
3. 1 cm diverticulum of the trachea at the level the thyroid.

## 2020-10-02 DIAGNOSIS — E538 Deficiency of other specified B group vitamins: Secondary | ICD-10-CM | POA: Diagnosis not present

## 2020-10-08 DIAGNOSIS — Z8669 Personal history of other diseases of the nervous system and sense organs: Secondary | ICD-10-CM | POA: Diagnosis not present

## 2020-10-08 DIAGNOSIS — R519 Headache, unspecified: Secondary | ICD-10-CM | POA: Diagnosis not present

## 2020-11-24 DIAGNOSIS — M545 Low back pain, unspecified: Secondary | ICD-10-CM | POA: Diagnosis not present

## 2020-11-24 DIAGNOSIS — M5441 Lumbago with sciatica, right side: Secondary | ICD-10-CM | POA: Diagnosis not present

## 2021-02-03 DIAGNOSIS — R519 Headache, unspecified: Secondary | ICD-10-CM | POA: Diagnosis not present

## 2021-02-09 DIAGNOSIS — I10 Essential (primary) hypertension: Secondary | ICD-10-CM | POA: Diagnosis not present

## 2021-02-09 DIAGNOSIS — R1032 Left lower quadrant pain: Secondary | ICD-10-CM | POA: Diagnosis not present

## 2021-02-26 DIAGNOSIS — Z0000 Encounter for general adult medical examination without abnormal findings: Secondary | ICD-10-CM | POA: Diagnosis not present

## 2021-02-26 DIAGNOSIS — I10 Essential (primary) hypertension: Secondary | ICD-10-CM | POA: Diagnosis not present

## 2021-02-26 DIAGNOSIS — E559 Vitamin D deficiency, unspecified: Secondary | ICD-10-CM | POA: Diagnosis not present

## 2021-02-26 DIAGNOSIS — Z79899 Other long term (current) drug therapy: Secondary | ICD-10-CM | POA: Diagnosis not present

## 2021-02-26 DIAGNOSIS — Z720 Tobacco use: Secondary | ICD-10-CM | POA: Diagnosis not present

## 2021-02-26 DIAGNOSIS — J9811 Atelectasis: Secondary | ICD-10-CM | POA: Diagnosis not present

## 2021-02-26 DIAGNOSIS — Z1231 Encounter for screening mammogram for malignant neoplasm of breast: Secondary | ICD-10-CM | POA: Diagnosis not present

## 2021-02-26 DIAGNOSIS — E538 Deficiency of other specified B group vitamins: Secondary | ICD-10-CM | POA: Diagnosis not present

## 2021-02-26 DIAGNOSIS — E785 Hyperlipidemia, unspecified: Secondary | ICD-10-CM | POA: Diagnosis not present

## 2021-02-26 DIAGNOSIS — Z1211 Encounter for screening for malignant neoplasm of colon: Secondary | ICD-10-CM | POA: Diagnosis not present

## 2021-03-05 DIAGNOSIS — E785 Hyperlipidemia, unspecified: Secondary | ICD-10-CM | POA: Diagnosis not present

## 2021-03-05 DIAGNOSIS — G44229 Chronic tension-type headache, not intractable: Secondary | ICD-10-CM | POA: Diagnosis not present

## 2021-03-05 DIAGNOSIS — I2510 Atherosclerotic heart disease of native coronary artery without angina pectoris: Secondary | ICD-10-CM | POA: Diagnosis not present

## 2021-03-05 DIAGNOSIS — I6529 Occlusion and stenosis of unspecified carotid artery: Secondary | ICD-10-CM | POA: Diagnosis not present

## 2021-03-05 DIAGNOSIS — H6121 Impacted cerumen, right ear: Secondary | ICD-10-CM | POA: Diagnosis not present

## 2021-03-05 DIAGNOSIS — I10 Essential (primary) hypertension: Secondary | ICD-10-CM | POA: Diagnosis not present

## 2021-03-05 DIAGNOSIS — F411 Generalized anxiety disorder: Secondary | ICD-10-CM | POA: Diagnosis not present

## 2021-03-05 DIAGNOSIS — I6523 Occlusion and stenosis of bilateral carotid arteries: Secondary | ICD-10-CM | POA: Diagnosis not present

## 2021-03-05 DIAGNOSIS — J329 Chronic sinusitis, unspecified: Secondary | ICD-10-CM | POA: Diagnosis not present

## 2021-03-05 DIAGNOSIS — E538 Deficiency of other specified B group vitamins: Secondary | ICD-10-CM | POA: Diagnosis not present

## 2021-03-23 ENCOUNTER — Encounter (INDEPENDENT_AMBULATORY_CARE_PROVIDER_SITE_OTHER): Payer: Self-pay | Admitting: Vascular Surgery

## 2021-03-23 ENCOUNTER — Other Ambulatory Visit: Payer: Self-pay

## 2021-03-23 ENCOUNTER — Ambulatory Visit (INDEPENDENT_AMBULATORY_CARE_PROVIDER_SITE_OTHER): Payer: Medicare HMO | Admitting: Vascular Surgery

## 2021-03-23 VITALS — BP 152/65 | HR 66 | Resp 16 | Ht 61.0 in | Wt 112.6 lb

## 2021-03-23 DIAGNOSIS — I6523 Occlusion and stenosis of bilateral carotid arteries: Secondary | ICD-10-CM | POA: Diagnosis not present

## 2021-03-23 DIAGNOSIS — E7800 Pure hypercholesterolemia, unspecified: Secondary | ICD-10-CM | POA: Diagnosis not present

## 2021-03-23 DIAGNOSIS — I2510 Atherosclerotic heart disease of native coronary artery without angina pectoris: Secondary | ICD-10-CM | POA: Diagnosis not present

## 2021-03-23 NOTE — Progress Notes (Signed)
MRN : [id number]  Alyssa Watson is a 65 y.o. (Mar 12, 1956) female who presents with chief complaint of  Chief Complaint  Patient presents with  . New Patient (Initial Visit)    Ref [place] carotid stenosis  .  History of Present Illness:   The patient is seen for follow up evaluation of carotid stenosis. The patient has not been seen since May 2020.  She is status post left carotid endarterectomy with primary closure on August 03, 2013.  In April 2020 and a duplex ultrasound suggested greater than 70% recurrent stenosis however, CT angiography was performed and the lesion was noted to be 60%.  Subsequent to this there were no ultrasounds recorded until recently when seen in Dr. Park Meo office a duplex ultrasound of the carotids was performed on March 05, 2021.  The patient denies amaurosis fugax. There is no recent history of TIA symptoms or focal motor deficits. There is no prior documented CVA.  The patient is taking enteric-coated aspirin 81 mg daily.  There is no history of migraine headaches. There is no history of seizures.  The patient has a history of coronary artery disease, no recent episodes of angina or shortness of breath. The patient denies PAD or claudication symptoms. There is a history of hyperlipidemia which is being treated with a statin.      Current Meds  Medication Sig  . ALPRAZolam (XANAX) 0.5 MG tablet Take 1 tablet (0.5 mg total) by mouth at bedtime as needed for anxiety.  Marland Kitchen aspirin EC 81 MG tablet Take 81 mg by mouth daily.  Marland Kitchen atorvastatin (LIPITOR) 80 MG tablet Take 80 mg by mouth daily.  . cetirizine (ZYRTEC) 10 MG tablet Take 10 mg by mouth daily.  . clopidogrel (PLAVIX) 75 MG tablet Take 75 mg by mouth daily.  Marland Kitchen gabapentin (NEURONTIN) 100 MG capsule Take 100 mg by mouth daily.  Marland Kitchen olopatadine (PATANOL) 0.1 % ophthalmic solution INSTILL 1 DROP INTO EACH AFFECTED EYE TWICE DAILY  . rosuvastatin (CRESTOR) 40 MG tablet Take 40 mg by mouth daily.  .  sertraline (ZOLOFT) 25 MG tablet Take 100 mg by mouth daily.   . Vitamin D, Ergocalciferol, (DRISDOL) 1.25 MG (50000 UT) CAPS capsule Take 50,000 Units by mouth once a week.    Past Medical History:  Diagnosis Date  . Anxiety and depression   . H/O carotid endarterectomy   . H/O uterine prolapse 03/2014  . Heart disease    h/o angioplasty  . Hyperlipemia   . Rash of face   . Sleep apnea   . Vaginal enterocele    1st degree cystocele    Past Surgical History:  Procedure Laterality Date  . ANGIOPLASTY  05/2013   stent placed  . CARPAL TUNNEL RELEASE Left   . CORONARY ANGIOPLASTY WITH STENT PLACEMENT    . VAGINAL HYSTERECTOMY  03/2014   with BSO    Social History Social History   Tobacco Use  . Smoking status: Current Every Day Smoker  . Smokeless tobacco: Current User  Substance Use Topics  . Alcohol use: No    Alcohol/week: 0.0 standard drinks  . Drug use: No    Family History Family History  Problem Relation Age of Onset  . Heart disease Mother   No family history of bleeding/clotting disorders, porphyria or autoimmune disease   Allergies  Allergen Reactions  . Codeine Rash  . Penicillins Rash    Did it involve swelling of the face/tongue/throat, SOB, or low BP? No  Did it involve sudden or severe rash/hives, skin peeling, or any reaction on the inside of your mouth or nose? No Did you need to seek medical attention at a hospital or doctor's office? No When did it last happen?20 years ago If all above answers are "NO", may proceed with cephalosporin use.     REVIEW OF SYSTEMS (Negative unless checked)  Constitutional: [] Weight loss  [] Fever  [] Chills Cardiac: [] Chest pain   [] Chest pressure   [] Palpitations   [] Shortness of breath when laying flat   [] Shortness of breath with exertion. Vascular:  [] Pain in legs with walking   [] Pain in legs at rest  [] History of DVT   [] Phlebitis   [] Swelling in legs   [] Varicose veins   [] Non-healing  ulcers Pulmonary:   [] Uses home oxygen   [] Productive cough   [] Hemoptysis   [] Wheeze  [] COPD   [] Asthma Neurologic:  [x] Dizziness   [] Seizures   [] History of stroke   [] History of TIA  [] Aphasia   [] Vissual changes   [] Weakness or numbness in arm   [] Weakness or numbness in leg Musculoskeletal:   [] Joint swelling   [] Joint pain   [] Low back pain Hematologic:  [] Easy bruising  [] Easy bleeding   [] Hypercoagulable state   [] Anemic Gastrointestinal:  [] Diarrhea   [] Vomiting  [] Gastroesophageal reflux/heartburn   [] Difficulty swallowing. Genitourinary:  [] Chronic kidney disease   [] Difficult urination  [] Frequent urination   [] Blood in urine Skin:  [] Rashes   [] Ulcers  Psychological:  [] History of anxiety   []  History of major depression.  Physical Examination  Vitals:   03/23/21 1022  BP: (!) 152/65  Pulse: 66  Resp: 16  Weight: 112 lb 9.6 oz (51.1 kg)  Height: 5\' 1"  (1.549 m)   Body mass index is 21.28 kg/m. Gen: WD/WN, NAD Head: [place]/AT, No temporalis wasting.  Ear/Nose/Throat: Hearing grossly intact, nares w/o erythema or drainage, poor dentition Eyes: PER, EOMI, sclera nonicteric.  Neck: Supple, no masses.  No bruit or JVD.  Pulmonary:  Good air movement, clear to auscultation bilaterally, no use of accessory muscles.  Cardiac: RRR, normal S1, S2, no Murmurs. Vascular: High-pitched left carotid bruit Vessel Right Left  Radial Palpable Palpable  Brachial Palpable Palpable  Carotid Palpable Palpable  Gastrointestinal: soft, non-distended. No guarding/no peritoneal signs.  Musculoskeletal: M/S 5/5 throughout.  No deformity or atrophy.  Neurologic: CN 2-12 intact. Pain and light touch intact in extremities.  Symmetrical.  Speech is fluent. Motor exam as listed above. Psychiatric: Judgment intact, Mood & affect appropriate for pt's clinical situation. Dermatologic: No rashes or ulcers noted.  No changes consistent with cellulitis.   CBC Lab Results  Component Value Date   WBC  4.6 05/21/2020   HGB 13.9 05/21/2020   HCT 40.7 05/21/2020   MCV 90.0 05/21/2020   PLT 201 05/21/2020    BMET    Component Value Date/Time   NA 140 05/21/2020 0420   NA 139 08/04/2013 0325   K 3.7 05/21/2020 0420   K 3.9 08/04/2013 0325   CL 108 05/21/2020 0420   CL 107 08/04/2013 0325   CO2 26 05/21/2020 0420   CO2 27 08/04/2013 0325   GLUCOSE 96 05/21/2020 0420   GLUCOSE 92 08/04/2013 0325   BUN 9 05/21/2020 0420   BUN 8 08/04/2013 0325   CREATININE 0.70 05/21/2020 0420   CREATININE 0.64 08/04/2013 0325   CALCIUM 9.3 05/21/2020 0420   CALCIUM 8.8 08/04/2013 0325   GFRNONAA >60 05/21/2020 0420   GFRNONAA >60 08/04/2013  0325   GFRAA >60 05/21/2020 0420   GFRAA >60 08/04/2013 0325   CrCl cannot be calculated (Patient's most recent lab result is older than the maximum 21 days allowed.).  COAG Lab Results  Component Value Date   INR 0.9 08/04/2013    Radiology No results found.   Assessment/Plan 1. Carotid stenosis, asymptomatic, bilateral Recommend:  Given the patient's asymptomatic subcritical stenosis no further invasive testing or surgery at this time.  Duplex ultrasound shows from April 7 will be obtained from Dr. Park Meo office as the results are not available at the time I saw the patient.   I will plan to see her back in 6 months for surveillance.  Continue antiplatelet therapy as prescribed Continue management of CAD, HTN and Hyperlipidemia Healthy heart diet,  encouraged exercise at least 4 times per week Follow up in 6 months with duplex ultrasound and physical exam  - VAS US CAROTID; Future  2. Coronary artery disease involving native coronary artery of native heart without angina pectoris Continue cardiac and antihypertensive medications as already ordered and reviewed, no changes at this time.  Continue statin as ordered and reviewed, no changes at this time  Nitrates PRN for chest pain   3. Pure hypercholesterolemia Continue statin as  ordered and reviewed, no changes at this time     Hortencia Pilar, MD  03/23/2021 10:34 AM

## 2021-03-26 ENCOUNTER — Telehealth (INDEPENDENT_AMBULATORY_CARE_PROVIDER_SITE_OTHER): Payer: Medicare Other | Admitting: Vascular Surgery

## 2021-03-26 DIAGNOSIS — I6523 Occlusion and stenosis of bilateral carotid arteries: Secondary | ICD-10-CM

## 2021-03-26 NOTE — Telephone Encounter (Signed)
I contacted the patient by phone.  We discussed the results of the duplex ultrasound that Dr. Doy Hutching had ordered.  Currently the right side has less than 30% internal carotid artery stenosis and the left side is approximately 60%.  I discussed the implications of this and have recommended that she come back and see [place] in 6 months with a follow-up ultrasound.  She agrees with this plan.  We will have her return in 6 months as noted above.

## 2021-04-03 DIAGNOSIS — G44229 Chronic tension-type headache, not intractable: Secondary | ICD-10-CM | POA: Diagnosis not present

## 2021-04-03 DIAGNOSIS — I10 Essential (primary) hypertension: Secondary | ICD-10-CM | POA: Diagnosis not present

## 2021-04-09 DIAGNOSIS — E538 Deficiency of other specified B group vitamins: Secondary | ICD-10-CM | POA: Diagnosis not present

## 2021-04-17 DIAGNOSIS — J40 Bronchitis, not specified as acute or chronic: Secondary | ICD-10-CM | POA: Diagnosis not present

## 2021-04-17 DIAGNOSIS — J019 Acute sinusitis, unspecified: Secondary | ICD-10-CM | POA: Diagnosis not present

## 2021-04-17 DIAGNOSIS — I10 Essential (primary) hypertension: Secondary | ICD-10-CM | POA: Diagnosis not present

## 2021-05-12 DIAGNOSIS — I2510 Atherosclerotic heart disease of native coronary artery without angina pectoris: Secondary | ICD-10-CM | POA: Diagnosis not present

## 2021-05-12 DIAGNOSIS — I10 Essential (primary) hypertension: Secondary | ICD-10-CM | POA: Diagnosis not present

## 2021-05-12 DIAGNOSIS — F411 Generalized anxiety disorder: Secondary | ICD-10-CM | POA: Diagnosis not present

## 2021-05-12 DIAGNOSIS — E7800 Pure hypercholesterolemia, unspecified: Secondary | ICD-10-CM | POA: Diagnosis not present

## 2021-05-12 DIAGNOSIS — Z79899 Other long term (current) drug therapy: Secondary | ICD-10-CM | POA: Diagnosis not present

## 2021-05-12 DIAGNOSIS — Z720 Tobacco use: Secondary | ICD-10-CM | POA: Diagnosis not present

## 2021-05-15 DIAGNOSIS — E538 Deficiency of other specified B group vitamins: Secondary | ICD-10-CM | POA: Diagnosis not present

## 2021-06-19 DIAGNOSIS — Z03818 Encounter for observation for suspected exposure to other biological agents ruled out: Secondary | ICD-10-CM | POA: Diagnosis not present

## 2021-06-19 DIAGNOSIS — R6889 Other general symptoms and signs: Secondary | ICD-10-CM | POA: Diagnosis not present

## 2021-06-19 DIAGNOSIS — R0981 Nasal congestion: Secondary | ICD-10-CM | POA: Diagnosis not present

## 2021-06-19 DIAGNOSIS — B349 Viral infection, unspecified: Secondary | ICD-10-CM | POA: Diagnosis not present

## 2021-07-27 ENCOUNTER — Other Ambulatory Visit: Payer: Self-pay | Admitting: Internal Medicine

## 2021-07-27 DIAGNOSIS — Z1231 Encounter for screening mammogram for malignant neoplasm of breast: Secondary | ICD-10-CM

## 2021-08-07 DIAGNOSIS — E7800 Pure hypercholesterolemia, unspecified: Secondary | ICD-10-CM | POA: Diagnosis not present

## 2021-08-07 DIAGNOSIS — I10 Essential (primary) hypertension: Secondary | ICD-10-CM | POA: Diagnosis not present

## 2021-08-07 DIAGNOSIS — R7309 Other abnormal glucose: Secondary | ICD-10-CM | POA: Diagnosis not present

## 2021-08-07 DIAGNOSIS — Z79899 Other long term (current) drug therapy: Secondary | ICD-10-CM | POA: Diagnosis not present

## 2021-08-14 DIAGNOSIS — I2510 Atherosclerotic heart disease of native coronary artery without angina pectoris: Secondary | ICD-10-CM | POA: Diagnosis not present

## 2021-08-14 DIAGNOSIS — I10 Essential (primary) hypertension: Secondary | ICD-10-CM | POA: Diagnosis not present

## 2021-08-14 DIAGNOSIS — Z720 Tobacco use: Secondary | ICD-10-CM | POA: Diagnosis not present

## 2021-08-14 DIAGNOSIS — Z23 Encounter for immunization: Secondary | ICD-10-CM | POA: Diagnosis not present

## 2021-08-14 DIAGNOSIS — Z79899 Other long term (current) drug therapy: Secondary | ICD-10-CM | POA: Diagnosis not present

## 2021-08-14 DIAGNOSIS — Z1211 Encounter for screening for malignant neoplasm of colon: Secondary | ICD-10-CM | POA: Diagnosis not present

## 2021-08-14 DIAGNOSIS — Z1231 Encounter for screening mammogram for malignant neoplasm of breast: Secondary | ICD-10-CM | POA: Diagnosis not present

## 2021-08-14 DIAGNOSIS — E7800 Pure hypercholesterolemia, unspecified: Secondary | ICD-10-CM | POA: Diagnosis not present

## 2021-08-14 DIAGNOSIS — R739 Hyperglycemia, unspecified: Secondary | ICD-10-CM | POA: Diagnosis not present

## 2021-09-21 ENCOUNTER — Other Ambulatory Visit: Payer: Self-pay

## 2021-09-21 ENCOUNTER — Ambulatory Visit (INDEPENDENT_AMBULATORY_CARE_PROVIDER_SITE_OTHER): Payer: Medicare HMO

## 2021-09-21 ENCOUNTER — Ambulatory Visit (INDEPENDENT_AMBULATORY_CARE_PROVIDER_SITE_OTHER): Payer: Medicare HMO | Admitting: Vascular Surgery

## 2021-09-21 VITALS — BP 125/69 | HR 70 | Ht 71.0 in | Wt 108.0 lb

## 2021-09-21 DIAGNOSIS — I10 Essential (primary) hypertension: Secondary | ICD-10-CM | POA: Diagnosis not present

## 2021-09-21 DIAGNOSIS — I6523 Occlusion and stenosis of bilateral carotid arteries: Secondary | ICD-10-CM | POA: Diagnosis not present

## 2021-09-21 DIAGNOSIS — E7800 Pure hypercholesterolemia, unspecified: Secondary | ICD-10-CM

## 2021-09-21 DIAGNOSIS — I2510 Atherosclerotic heart disease of native coronary artery without angina pectoris: Secondary | ICD-10-CM

## 2021-09-25 DIAGNOSIS — J069 Acute upper respiratory infection, unspecified: Secondary | ICD-10-CM | POA: Diagnosis not present

## 2021-09-27 ENCOUNTER — Encounter (INDEPENDENT_AMBULATORY_CARE_PROVIDER_SITE_OTHER): Payer: Self-pay | Admitting: Vascular Surgery

## 2021-09-27 NOTE — Progress Notes (Signed)
MRN : [id number]  Alyssa Watson is a 65 y.o. (Mar 10, 1956) female who presents with chief complaint of check carotid.  History of Present Illness:   The patient is seen for follow up evaluation of carotid stenosis.   She is status post left carotid endarterectomy with primary closure on August 03, 2013.  In April 2020 and a duplex ultrasound suggested greater than 70% recurrent stenosis however, CT angiography was performed and the lesion was noted to be 60%.     The patient denies amaurosis fugax. There is no recent history of TIA symptoms or focal motor deficits. There is no prior documented CVA.   The patient is taking enteric-coated aspirin 81 mg daily.   There is no history of migraine headaches. There is no history of seizures.   The patient has a history of coronary artery disease, no recent episodes of angina or shortness of breath. The patient denies PAD or claudication symptoms. There is a history of hyperlipidemia which is being treated with a statin.    Duplex ultrasound of the carotid arteries RICA 8-18% and LICA 29-93%  Current Meds  Medication Sig   ALPRAZolam (XANAX) 1 MG tablet    aspirin EC 81 MG tablet Take 81 mg by mouth daily.   atorvastatin (LIPITOR) 80 MG tablet Take 80 mg by mouth daily.   azithromycin (ZITHROMAX) 250 MG tablet    cetirizine (ZYRTEC) 10 MG tablet Take 10 mg by mouth daily.   clopidogrel (PLAVIX) 75 MG tablet Take 75 mg by mouth daily.   gabapentin (NEURONTIN) 100 MG capsule Take 100 mg by mouth daily.   olopatadine (PATANOL) 0.1 % ophthalmic solution INSTILL 1 DROP INTO EACH AFFECTED EYE TWICE DAILY   rosuvastatin (CRESTOR) 40 MG tablet Take 40 mg by mouth daily.   sertraline (ZOLOFT) 25 MG tablet Take 100 mg by mouth daily.    Vitamin D, Ergocalciferol, (DRISDOL) 1.25 MG (50000 UT) CAPS capsule Take 50,000 Units by mouth once a week.   [DISCONTINUED] ALPRAZolam (XANAX) 0.5 MG tablet Take 1 tablet (0.5 mg total) by mouth at bedtime  as needed for anxiety.   [DISCONTINUED] Cetirizine HCl (ZYRTEC ALLERGY) 10 MG CAPS Take by mouth.    Past Medical History:  Diagnosis Date   Anxiety and depression    H/O carotid endarterectomy    H/O uterine prolapse 03/2014   Heart disease    h/o angioplasty   Hyperlipemia    Rash of face    Sleep apnea    Vaginal enterocele    1st degree cystocele    Past Surgical History:  Procedure Laterality Date   ANGIOPLASTY  05/2013   stent placed   CARPAL TUNNEL RELEASE Left    CORONARY ANGIOPLASTY WITH STENT PLACEMENT     VAGINAL HYSTERECTOMY  03/2014   with BSO    Social History Social History   Tobacco Use   Smoking status: Every Day   Smokeless tobacco: Current  Substance Use Topics   Alcohol use: No    Alcohol/week: 0.0 standard drinks   Drug use: No    Family History Family History  Problem Relation Age of Onset   Heart disease Mother     Allergies  Allergen Reactions   Codeine Rash   Penicillins Rash    Did it involve swelling of the face/tongue/throat, SOB, or low BP? No Did it involve sudden or severe rash/hives, skin peeling, or any reaction on the inside of your mouth or nose? No Did you need  to seek medical attention at a hospital or doctor's office? No When did it last happen?      20 years ago If all above answers are "NO", may proceed with cephalosporin use.     REVIEW OF SYSTEMS (Negative unless checked)  Constitutional: [] Weight loss  [] Fever  [] Chills Cardiac: [] Chest pain   [] Chest pressure   [] Palpitations   [] Shortness of breath when laying flat   [] Shortness of breath with exertion. Vascular:  [] Pain in legs with walking   [] Pain in legs at rest  [] History of DVT   [] Phlebitis   [] Swelling in legs   [] Varicose veins   [] Non-healing ulcers Pulmonary:   [] Uses home oxygen   [] Productive cough   [] Hemoptysis   [] Wheeze  [] COPD   [] Asthma Neurologic:  [] Dizziness   [] Seizures   [] History of stroke   [] History of TIA  [] Aphasia   [] Vissual  changes   [] Weakness or numbness in arm   [] Weakness or numbness in leg Musculoskeletal:   [] Joint swelling   [] Joint pain   [] Low back pain Hematologic:  [] Easy bruising  [] Easy bleeding   [] Hypercoagulable state   [] Anemic Gastrointestinal:  [] Diarrhea   [] Vomiting  [] Gastroesophageal reflux/heartburn   [] Difficulty swallowing. Genitourinary:  [] Chronic kidney disease   [] Difficult urination  [] Frequent urination   [] Blood in urine Skin:  [] Rashes   [] Ulcers  Psychological:  [] History of anxiety   []  History of major depression.  Physical Examination  Vitals:   09/21/21 1350  BP: 125/69  Pulse: 70  Weight: 108 lb (49 kg)  Height: 5\' 11"  (1.803 m)   Body mass index is 15.06 kg/m. Gen: WD/WN, NAD Head: [place]/AT, No temporalis wasting.  Ear/Nose/Throat: Hearing grossly intact, nares w/o erythema or drainage Eyes: PER, EOMI, sclera nonicteric.  Neck: Supple, no masses.  No bruit or JVD.  Pulmonary:  Good air movement, no audible wheezing, no use of accessory muscles.  Cardiac: RRR, normal S1, S2, no Murmurs. Vascular:   left carotid bruit Vessel Right Left  Radial Palpable Palpable  Carotid Palpable Palpable  Gastrointestinal: soft, non-distended. No guarding/no peritoneal signs.  Musculoskeletal: M/S 5/5 throughout.  No visible deformity.  Neurologic: CN 2-12 intact. Pain and light touch intact in extremities.  Symmetrical.  Speech is fluent. Motor exam as listed above. Psychiatric: Judgment intact, Mood & affect appropriate for pt's clinical situation. Dermatologic: No rashes or ulcers noted.  No changes consistent with cellulitis.   CBC Lab Results  Component Value Date   WBC 4.6 05/21/2020   HGB 13.9 05/21/2020   HCT 40.7 05/21/2020   MCV 90.0 05/21/2020   PLT 201 05/21/2020    BMET    Component Value Date/Time   NA 140 05/21/2020 0420   NA 139 08/04/2013 0325   K 3.7 05/21/2020 0420   K 3.9 08/04/2013 0325   CL 108 05/21/2020 0420   CL 107 08/04/2013 0325   CO2  26 05/21/2020 0420   CO2 27 08/04/2013 0325   GLUCOSE 96 05/21/2020 0420   GLUCOSE 92 08/04/2013 0325   BUN 9 05/21/2020 0420   BUN 8 08/04/2013 0325   CREATININE 0.70 05/21/2020 0420   CREATININE 0.64 08/04/2013 0325   CALCIUM 9.3 05/21/2020 0420   CALCIUM 8.8 08/04/2013 0325   GFRNONAA >60 05/21/2020 0420   GFRNONAA >60 08/04/2013 0325   GFRAA >60 05/21/2020 0420   GFRAA >60 08/04/2013 0325   CrCl cannot be calculated (Patient's most recent lab result is older than the maximum 21 days  allowed.).  COAG Lab Results  Component Value Date   INR 0.9 08/04/2013    Radiology VAS US CAROTID  Result Date: 09/24/2021 Carotid Arterial Duplex Study Patient Name:  Alyssa Watson  Date of Exam:   09/21/2021 Medical Rec #: [id number]        Accession #:    [id number] Date of Birth: [birth year]       Patient Gender: F Patient Age:   65 years Exam Location:  [facility] Procedure:      VAS US CAROTID Referring Phys: Hortencia Pilar --------------------------------------------------------------------------------  Indications: Carotid artery disease and left endarterectomy. Performing Technologist: Blondell Reveal RT, RDMS, RVT  Examination Guidelines: A complete evaluation includes B-mode imaging, spectral Doppler, color Doppler, and power Doppler as needed of all accessible portions of each vessel. Bilateral testing is considered an integral part of a complete examination. Limited examinations for reoccurring indications may be performed as noted.  Right Carotid Findings: +----------+--------+--------+--------+------------------+------------------+           PSV cm/sEDV cm/sStenosisPlaque DescriptionComments           +----------+--------+--------+--------+------------------+------------------+ CCA Prox  69      18                                                   +----------+--------+--------+--------+------------------+------------------+ CCA Mid   71      22                                                    +----------+--------+--------+--------+------------------+------------------+ CCA Distal66      17                                intimal thickening +----------+--------+--------+--------+------------------+------------------+ ICA Prox  76      30      1-39%   heterogenous      Ratio=1.1          +----------+--------+--------+--------+------------------+------------------+ ICA Mid   80      31                                                   +----------+--------+--------+--------+------------------+------------------+ ICA Distal99      44                                                   +----------+--------+--------+--------+------------------+------------------+ ECA       84      13              heterogenous                         +----------+--------+--------+--------+------------------+------------------+ +----------+--------+-------+----------------+-------------------+           PSV cm/sEDV cmsDescribe        Arm Pressure (mmHG) +----------+--------+-------+----------------+-------------------+ [id number]  Multiphasic, WNL                    +----------+--------+-------+----------------+-------------------+ +---------+--------+--+--------+---------+ VertebralPSV cm/s63EDV cm/sAntegrade +---------+--------+--+--------+---------+  Left Carotid Findings: +----------+--------+--------+--------+------------------+---------------------+           PSV cm/sEDV cm/sStenosisPlaque DescriptionComments              +----------+--------+--------+--------+------------------+---------------------+ CCA Prox  85      23                                                      +----------+--------+--------+--------+------------------+---------------------+ CCA Mid   68      19                                                       +----------+--------+--------+--------+------------------+---------------------+ CCA Distal79      23                                                      +----------+--------+--------+--------+------------------+---------------------+ ICA Prox  215     69                                Ratio=3.2; intimal                                                        thickening            +----------+--------+--------+--------+------------------+---------------------+ ICA Mid   120     36                                                      +----------+--------+--------+--------+------------------+---------------------+ ICA Distal97      35                                                      +----------+--------+--------+--------+------------------+---------------------+ ECA       73      14                                                      +----------+--------+--------+--------+------------------+---------------------+ +----------+--------+--------+----------------+-------------------+           PSV cm/sEDV cm/sDescribe        Arm Pressure (mmHG) +----------+--------+--------+----------------+-------------------+ Subclavian102             Multiphasic, WNL                    +----------+--------+--------+----------------+-------------------+ +---------+--------+--+--------+---------+  VertebralPSV cm/s60EDV cm/sAntegrade +---------+--------+--+--------+---------+  Summary: Right Carotid: Velocities in the right ICA are consistent with a 1-39% stenosis.                The extracranial vessels were near-normal with only minimal wall                thickening or plaque. Left Carotid: Patent endarterectomy sitte with a left proximal/mid ICA velocity               consistent with a 60-79% stenosis. Velocities of the bilateral ICAs appear less than previously recorded when compared to the previous exam on 03/08/21. *See table(s) above for measurements and observations.   Electronically signed by Hortencia Pilar MD on 09/24/2021 at 12:56:14 PM.    Final      Assessment/Plan 1. Carotid stenosis, asymptomatic, bilateral Recommend:  Given the patient's asymptomatic subcritical stenosis no further invasive testing or surgery at this time.  Duplex ultrasound of the carotid arteries RICA 8-27% and LICA 07-86%.  Continue antiplatelet therapy as prescribed Continue management of CAD, HTN and Hyperlipidemia Healthy heart diet,  encouraged exercise at least 4 times per week Follow up in 12 months with duplex ultrasound and physical exam   - VAS US CAROTID; Future  2. Coronary artery disease involving native coronary artery of native heart without angina pectoris Continue cardiac and antihypertensive medications as already ordered and reviewed, no changes at this time.  Continue statin as ordered and reviewed, no changes at this time  Nitrates PRN for chest pain   3. HTN, goal below 140/80 Continue antihypertensive medications as already ordered, these medications have been reviewed and there are no changes at this time.   4. Pure hypercholesterolemia Continue statin as ordered and reviewed, no changes at this time     Hortencia Pilar, MD  09/27/2021 12:15 PM

## 2021-10-02 DIAGNOSIS — J069 Acute upper respiratory infection, unspecified: Secondary | ICD-10-CM | POA: Diagnosis not present

## 2021-10-02 DIAGNOSIS — G43709 Chronic migraine without aura, not intractable, without status migrainosus: Secondary | ICD-10-CM | POA: Diagnosis not present

## 2021-10-15 DIAGNOSIS — H6121 Impacted cerumen, right ear: Secondary | ICD-10-CM | POA: Diagnosis not present

## 2021-10-15 DIAGNOSIS — J309 Allergic rhinitis, unspecified: Secondary | ICD-10-CM | POA: Diagnosis not present

## 2021-11-03 DIAGNOSIS — J40 Bronchitis, not specified as acute or chronic: Secondary | ICD-10-CM | POA: Diagnosis not present

## 2021-11-03 DIAGNOSIS — Z03818 Encounter for observation for suspected exposure to other biological agents ruled out: Secondary | ICD-10-CM | POA: Diagnosis not present

## 2021-12-08 DIAGNOSIS — E538 Deficiency of other specified B group vitamins: Secondary | ICD-10-CM | POA: Diagnosis not present

## 2021-12-10 DIAGNOSIS — E538 Deficiency of other specified B group vitamins: Secondary | ICD-10-CM | POA: Diagnosis not present

## 2021-12-10 DIAGNOSIS — R7309 Other abnormal glucose: Secondary | ICD-10-CM | POA: Diagnosis not present

## 2021-12-10 DIAGNOSIS — J40 Bronchitis, not specified as acute or chronic: Secondary | ICD-10-CM | POA: Diagnosis not present

## 2021-12-10 DIAGNOSIS — Z79899 Other long term (current) drug therapy: Secondary | ICD-10-CM | POA: Diagnosis not present

## 2021-12-10 DIAGNOSIS — I10 Essential (primary) hypertension: Secondary | ICD-10-CM | POA: Diagnosis not present

## 2021-12-10 DIAGNOSIS — E7800 Pure hypercholesterolemia, unspecified: Secondary | ICD-10-CM | POA: Diagnosis not present

## 2021-12-10 DIAGNOSIS — J019 Acute sinusitis, unspecified: Secondary | ICD-10-CM | POA: Diagnosis not present

## 2021-12-14 DIAGNOSIS — D582 Other hemoglobinopathies: Secondary | ICD-10-CM | POA: Diagnosis not present

## 2021-12-15 DIAGNOSIS — D582 Other hemoglobinopathies: Secondary | ICD-10-CM | POA: Diagnosis not present

## 2021-12-25 DIAGNOSIS — E7800 Pure hypercholesterolemia, unspecified: Secondary | ICD-10-CM | POA: Diagnosis not present

## 2021-12-25 DIAGNOSIS — Z720 Tobacco use: Secondary | ICD-10-CM | POA: Diagnosis not present

## 2021-12-25 DIAGNOSIS — D649 Anemia, unspecified: Secondary | ICD-10-CM | POA: Diagnosis not present

## 2021-12-25 DIAGNOSIS — I2510 Atherosclerotic heart disease of native coronary artery without angina pectoris: Secondary | ICD-10-CM | POA: Diagnosis not present

## 2021-12-25 DIAGNOSIS — I10 Essential (primary) hypertension: Secondary | ICD-10-CM | POA: Diagnosis not present

## 2022-01-01 ENCOUNTER — Encounter: Payer: Self-pay | Admitting: *Deleted

## 2022-01-04 ENCOUNTER — Other Ambulatory Visit: Payer: Self-pay | Admitting: Oncology

## 2022-01-04 ENCOUNTER — Encounter: Payer: Self-pay | Admitting: Oncology

## 2022-01-04 ENCOUNTER — Other Ambulatory Visit: Payer: Self-pay

## 2022-01-04 ENCOUNTER — Inpatient Hospital Stay: Payer: Medicare HMO | Attending: Oncology | Admitting: Oncology

## 2022-01-04 ENCOUNTER — Encounter (INDEPENDENT_AMBULATORY_CARE_PROVIDER_SITE_OTHER): Payer: Self-pay

## 2022-01-04 ENCOUNTER — Inpatient Hospital Stay: Payer: Medicare HMO

## 2022-01-04 DIAGNOSIS — Z885 Allergy status to narcotic agent status: Secondary | ICD-10-CM | POA: Diagnosis not present

## 2022-01-04 DIAGNOSIS — Z880 Allergy status to penicillin: Secondary | ICD-10-CM | POA: Diagnosis not present

## 2022-01-04 DIAGNOSIS — Z8249 Family history of ischemic heart disease and other diseases of the circulatory system: Secondary | ICD-10-CM | POA: Diagnosis not present

## 2022-01-04 DIAGNOSIS — Z79899 Other long term (current) drug therapy: Secondary | ICD-10-CM | POA: Diagnosis not present

## 2022-01-04 DIAGNOSIS — R5383 Other fatigue: Secondary | ICD-10-CM | POA: Diagnosis not present

## 2022-01-04 DIAGNOSIS — R6339 Other feeding difficulties: Secondary | ICD-10-CM

## 2022-01-04 DIAGNOSIS — Z836 Family history of other diseases of the respiratory system: Secondary | ICD-10-CM | POA: Diagnosis not present

## 2022-01-04 DIAGNOSIS — Z90722 Acquired absence of ovaries, bilateral: Secondary | ICD-10-CM

## 2022-01-04 DIAGNOSIS — F17210 Nicotine dependence, cigarettes, uncomplicated: Secondary | ICD-10-CM | POA: Diagnosis not present

## 2022-01-04 DIAGNOSIS — D509 Iron deficiency anemia, unspecified: Secondary | ICD-10-CM

## 2022-01-04 DIAGNOSIS — I10 Essential (primary) hypertension: Secondary | ICD-10-CM | POA: Diagnosis not present

## 2022-01-04 DIAGNOSIS — E611 Iron deficiency: Secondary | ICD-10-CM | POA: Diagnosis not present

## 2022-01-04 DIAGNOSIS — J019 Acute sinusitis, unspecified: Secondary | ICD-10-CM | POA: Diagnosis not present

## 2022-01-04 HISTORY — DX: Iron deficiency anemia, unspecified: D50.9

## 2022-01-04 NOTE — Progress Notes (Signed)
Hematology/Oncology Consult note [phone] [phone]         Patient Care Team: Idelle Crouch, MD as PCP - General (Internal Medicine) Earlie Server, MD as Consulting Physician (Hematology)  REFERRING PROVIDER: Idelle Crouch, MD  CHIEF COMPLAINTS/REASON FOR VISIT:  Evaluation of anemia  HISTORY OF PRESENTING ILLNESS:   Alyssa Watson is a  66 y.o.  female with PMH listed below was seen in consultation at the request of  Idelle Crouch, MD  for evaluation of anemia  08/07/2022, CBC showed a hemoglobin of 11.1.  MCV 87.9. 12/10/2020, hemoglobin was decreased to 8.6, MCV 81.7, 12/25/2020, repeat CBC showed a hemoglobin of 8.7, MCV 79.5, iron saturation 4, ferritin 10.  Patient was recommended to take oral iron supplementation 3 times a day.  She has started for about a week overall tolerates well.  She noticed that her stool has turned black.  She did Denies any vaginal bleeding, black tarry stools or blood in the stool.  She reports feeling tired.  No pica symptoms. Patient has never had a colonoscopy.  Patient denies any family history of colon cancer. Patient reports that she is a picky eater.  Diet is not very good.  She has lost 2 to 3 pounds since April 2022.  She denies any abdominal pain, nausea vomiting diarrhea.  Review of Systems  Constitutional:  Positive for fatigue. Negative for appetite change, chills and fever.  HENT:   Negative for hearing loss and voice change.   Eyes:  Negative for eye problems.  Respiratory:  Negative for chest tightness and cough.   Cardiovascular:  Negative for chest pain.  Gastrointestinal:  Negative for abdominal distention, abdominal pain and blood in stool.  Endocrine: Negative for hot flashes.  Genitourinary:  Negative for difficulty urinating and frequency.   Musculoskeletal:  Negative for arthralgias.  Skin:  Negative for itching and rash.  Neurological:  Negative for extremity weakness.  Hematological:   Negative for adenopathy.  Psychiatric/Behavioral:  Negative for confusion.    MEDICAL HISTORY:  Past Medical History:  Diagnosis Date   Anemia    Anxiety and depression    H/O carotid endarterectomy    H/O uterine prolapse 03/2014   Heart disease    h/o angioplasty   Hyperlipemia    IDA (iron deficiency anemia) 01/04/2022   Rash of face    Sleep apnea    Vaginal enterocele    1st degree cystocele    SURGICAL HISTORY: Past Surgical History:  Procedure Laterality Date   ANGIOPLASTY  05/2013   stent placed   CARPAL TUNNEL RELEASE Left    CORONARY ANGIOPLASTY WITH STENT PLACEMENT     VAGINAL HYSTERECTOMY  03/2014   with BSO    SOCIAL HISTORY: Social History   Socioeconomic History   Marital status: Married    Spouse name: Not on file   Number of children: Not on file   Years of education: Not on file   Highest education level: Not on file  Occupational History   Not on file  Tobacco Use   Smoking status: Every Day    Packs/day: 0.50    Years: 33.00    Pack years: 16.50    Types: Cigarettes    Passive exposure: Never   Smokeless tobacco: Current  Substance and Sexual Activity   Alcohol use: No    Alcohol/week: 0.0 standard drinks   Drug use: No   Sexual activity: Yes    Birth control/protection: None  Other  Topics Concern   Not on file  Social History Narrative   Not on file   Social Determinants of Health   Financial Resource Strain: Not on file  Food Insecurity: Not on file  Transportation Needs: Not on file  Physical Activity: Not on file  Stress: Not on file  Social Connections: Not on file  Intimate Partner Violence: Not on file    FAMILY HISTORY: Family History  Problem Relation Age of Onset   Heart disease Mother    COPD Mother     ALLERGIES:  is allergic to azelastine, fluticasone, codeine, and penicillins.  MEDICATIONS:  Current Outpatient Medications  Medication Sig Dispense Refill   ALPRAZolam (XANAX) 1 MG tablet      atorvastatin  (LIPITOR) 80 MG tablet Take 80 mg by mouth daily.     azithromycin (ZITHROMAX) 250 MG tablet      cetirizine (ZYRTEC) 10 MG tablet Take 10 mg by mouth daily.     clopidogrel (PLAVIX) 75 MG tablet Take 75 mg by mouth daily.     doxycycline (VIBRA-TABS) 100 MG tablet      ferrous sulfate 325 (65 FE) MG EC tablet Take by mouth.     gabapentin (NEURONTIN) 100 MG capsule Take 100 mg by mouth daily.     olopatadine (PATANOL) 0.1 % ophthalmic solution INSTILL 1 DROP INTO EACH AFFECTED EYE TWICE DAILY     rosuvastatin (CRESTOR) 40 MG tablet Take 40 mg by mouth daily.     sertraline (ZOLOFT) 25 MG tablet Take 100 mg by mouth daily.      Vitamin D, Ergocalciferol, (DRISDOL) 1.25 MG (50000 UT) CAPS capsule Take 50,000 Units by mouth once a week.     aspirin EC 81 MG tablet Take 81 mg by mouth daily. (Patient not taking: Reported on 01/04/2022)     No current facility-administered medications for this visit.     PHYSICAL EXAMINATION: ECOG PERFORMANCE STATUS: 1 - Symptomatic but completely ambulatory Vitals:   01/04/22 1102  BP: (!) 121/57  Pulse: 72  Resp: 20  Temp: 98.7 F (37.1 C)  SpO2: 100%   Filed Weights   01/04/22 1102  Weight: 109 lb 8 oz (49.7 kg)    Physical Exam Constitutional:      General: She is not in acute distress.    Comments: Patient ambulates independently  HENT:     Head: Normocephalic and atraumatic.  Eyes:     General: No scleral icterus. Cardiovascular:     Rate and Rhythm: Normal rate and regular rhythm.     Heart sounds: Normal heart sounds.  Pulmonary:     Effort: Pulmonary effort is normal. No respiratory distress.     Breath sounds: No wheezing.  Abdominal:     General: Bowel sounds are normal. There is no distension.     Palpations: Abdomen is soft.  Musculoskeletal:        General: No deformity. Normal range of motion.     Cervical back: Normal range of motion and neck supple.  Skin:    General: Skin is warm and dry.     Coloration: Skin is  pale.     Findings: No erythema or rash.  Neurological:     Mental Status: She is alert and oriented to person, place, and time. Mental status is at baseline.     Cranial Nerves: No cranial nerve deficit.     Coordination: Coordination normal.  Psychiatric:        Mood and Affect: Mood  normal.    LABORATORY DATA:  I have reviewed the data as listed Lab Results  Component Value Date   WBC 4.6 05/21/2020   HGB 13.9 05/21/2020   HCT 40.7 05/21/2020   MCV 90.0 05/21/2020   PLT 201 05/21/2020   No results for input(s): NA, K, CL, CO2, GLUCOSE, BUN, CREATININE, CALCIUM, GFRNONAA, GFRAA, PROT, ALBUMIN, AST, ALT, ALKPHOS, BILITOT, BILIDIR, IBILI in the last 8760 hours. Iron/TIBC/Ferritin/ %Sat No results found for: IRON, TIBC, FERRITIN, IRONPCTSAT    RADIOGRAPHIC STUDIES: I have personally reviewed the radiological images as listed and agreed with the findings in the report. No results found.    ASSESSMENT & PLAN:  1. Iron deficiency anemia, unspecified iron deficiency anemia type    #Labs reviewed and discussed with patient.  Consistent with iron deficiency anemia. I discussed with her about option of oral iron supplementation with short-term follow-up or proceed with IV Venofer treatments.  Risks of infusion reactions including anaphylactic reactions were discussed with patient. Other side effects include but not limited to high blood pressure, headache,wheezing, SOB, skin rash and itchiness, weight gain, leg swelling, etc. Patient voices understanding and is willing to proceed. Plan IV iron with Venofer 200mg  weekly x 3 doses.  Etiology of iron deficiency is unknown.  I suspect the patient has GI blood loss.  She has never had a colonoscopy.  Recommend patient to establish care with gastroenterology.  Patient would like me to place a referral for her.   Orders Placed This Encounter  Procedures   CBC with Differential/Platelet    Standing Status:   Future    Standing  Expiration Date:   01/04/2023   Ferritin    Standing Status:   Future    Standing Expiration Date:   01/04/2023   Iron and TIBC    Standing Status:   Future    Standing Expiration Date:   01/04/2023   Ambulatory referral to Gastroenterology    Referral Priority:   Routine    Referral Type:   Consultation    Referral Reason:   Specialty Services Required    Referred to Provider:   Efrain Sella, MD    Requested Specialty:   Gastroenterology    Number of Visits Requested:   1    All questions were answered. The patient knows to call the clinic with any problems questions or concerns.  cc Idelle Crouch, MD    Return of visit: 3 months with repeat CBC iron TIBC ferritin prior to MD visit +/- Venofer. Thank you for this kind referral and the opportunity to participate in the care of this patient. A copy of today's note is routed to referring provider   Earlie Server, MD, PhD [facility] Hematology Oncology 01/04/2022

## 2022-01-11 DIAGNOSIS — K219 Gastro-esophageal reflux disease without esophagitis: Secondary | ICD-10-CM | POA: Diagnosis not present

## 2022-01-11 DIAGNOSIS — D509 Iron deficiency anemia, unspecified: Secondary | ICD-10-CM | POA: Diagnosis not present

## 2022-01-11 DIAGNOSIS — Z791 Long term (current) use of non-steroidal anti-inflammatories (NSAID): Secondary | ICD-10-CM | POA: Diagnosis not present

## 2022-01-11 DIAGNOSIS — R6889 Other general symptoms and signs: Secondary | ICD-10-CM | POA: Diagnosis not present

## 2022-01-11 DIAGNOSIS — R1310 Dysphagia, unspecified: Secondary | ICD-10-CM | POA: Diagnosis not present

## 2022-01-13 DIAGNOSIS — Z01810 Encounter for preprocedural cardiovascular examination: Secondary | ICD-10-CM | POA: Diagnosis not present

## 2022-01-13 DIAGNOSIS — Z23 Encounter for immunization: Secondary | ICD-10-CM | POA: Diagnosis not present

## 2022-01-13 DIAGNOSIS — Z01818 Encounter for other preprocedural examination: Secondary | ICD-10-CM | POA: Diagnosis not present

## 2022-01-13 DIAGNOSIS — I10 Essential (primary) hypertension: Secondary | ICD-10-CM | POA: Diagnosis not present

## 2022-01-13 DIAGNOSIS — Q2112 Patent foramen ovale: Secondary | ICD-10-CM | POA: Diagnosis not present

## 2022-01-13 DIAGNOSIS — Z720 Tobacco use: Secondary | ICD-10-CM | POA: Diagnosis not present

## 2022-01-13 DIAGNOSIS — Q231 Congenital insufficiency of aortic valve: Secondary | ICD-10-CM | POA: Diagnosis not present

## 2022-01-13 DIAGNOSIS — I6523 Occlusion and stenosis of bilateral carotid arteries: Secondary | ICD-10-CM | POA: Diagnosis not present

## 2022-01-13 DIAGNOSIS — I700 Atherosclerosis of aorta: Secondary | ICD-10-CM | POA: Diagnosis not present

## 2022-01-14 DIAGNOSIS — D485 Neoplasm of uncertain behavior of skin: Secondary | ICD-10-CM | POA: Diagnosis not present

## 2022-01-14 DIAGNOSIS — L821 Other seborrheic keratosis: Secondary | ICD-10-CM | POA: Diagnosis not present

## 2022-01-14 DIAGNOSIS — C44622 Squamous cell carcinoma of skin of right upper limb, including shoulder: Secondary | ICD-10-CM | POA: Diagnosis not present

## 2022-01-15 ENCOUNTER — Other Ambulatory Visit: Payer: Self-pay

## 2022-01-15 ENCOUNTER — Inpatient Hospital Stay: Payer: Medicare HMO

## 2022-01-15 VITALS — BP 129/61 | HR 75 | Resp 20

## 2022-01-15 DIAGNOSIS — Z885 Allergy status to narcotic agent status: Secondary | ICD-10-CM | POA: Diagnosis not present

## 2022-01-15 DIAGNOSIS — Z880 Allergy status to penicillin: Secondary | ICD-10-CM | POA: Diagnosis not present

## 2022-01-15 DIAGNOSIS — R6339 Other feeding difficulties: Secondary | ICD-10-CM | POA: Diagnosis not present

## 2022-01-15 DIAGNOSIS — Z90722 Acquired absence of ovaries, bilateral: Secondary | ICD-10-CM | POA: Diagnosis not present

## 2022-01-15 DIAGNOSIS — F17210 Nicotine dependence, cigarettes, uncomplicated: Secondary | ICD-10-CM | POA: Diagnosis not present

## 2022-01-15 DIAGNOSIS — R5383 Other fatigue: Secondary | ICD-10-CM | POA: Diagnosis not present

## 2022-01-15 DIAGNOSIS — Z8249 Family history of ischemic heart disease and other diseases of the circulatory system: Secondary | ICD-10-CM | POA: Diagnosis not present

## 2022-01-15 DIAGNOSIS — D509 Iron deficiency anemia, unspecified: Secondary | ICD-10-CM

## 2022-01-15 DIAGNOSIS — Z79899 Other long term (current) drug therapy: Secondary | ICD-10-CM | POA: Diagnosis not present

## 2022-01-15 MED ORDER — SODIUM CHLORIDE 0.9 % IV SOLN: 200.0000 mg | Freq: Once | INTRAVENOUS | Status: DC

## 2022-01-15 MED ORDER — IRON SUCROSE 20 MG/ML IV SOLN
200.0000 mg | Freq: Once | INTRAVENOUS | Status: AC
  Administered 2022-01-15: 200 mg via INTRAVENOUS
  Filled 2022-01-15: qty 10

## 2022-01-15 MED ORDER — SODIUM CHLORIDE 0.9 % IV SOLN
Freq: Once | INTRAVENOUS | Status: AC
  Filled 2022-01-15: qty 250

## 2022-01-15 NOTE — Patient Instructions (Signed)
[facility] AT [place]  Discharge Instructions: Thank you for choosing [facility] to provide your oncology and hematology care.  If you have a lab appointment with the [facility], please go directly to the [facility] and check in at the registration area.  Wear comfortable clothing and clothing appropriate for easy access to any Portacath or PICC line.   We strive to give you quality time with your provider. You may need to reschedule your appointment if you arrive late (15 or more minutes).  Arriving late affects you and other patients whose appointments are after yours.  Also, if you miss three or more appointments without notifying the office, you may be dismissed from the clinic at the providers discretion.      For prescription refill requests, have your pharmacy contact our office and allow 72 hours for refills to be completed.    Today you received the following : Venofer   Iron Sucrose Injection What is this medication? IRON SUCROSE (EYE ern SOO krose) treats low levels of iron (iron deficiency anemia) in people with kidney disease. Iron is a mineral that plays an important role in making red blood cells, which carry oxygen from your lungs to the rest of your body. This medicine may be used for other purposes; ask your health care provider or pharmacist if you have questions. COMMON BRAND NAME(S): Venofer What should I tell my care team before I take this medication? They need to know if you have any of these conditions: Anemia not caused by low iron levels Heart disease High levels of iron in the blood Kidney disease Liver disease An unusual or allergic reaction to iron, other medications, foods, dyes, or preservatives Pregnant or trying to get pregnant Breast-feeding How should I use this medication? This medication is for infusion into a vein. It is given in a hospital or clinic setting. Talk to your care team about the use of this  medication in children. While this medication may be prescribed for children as young as 2 years for selected conditions, precautions do apply. Overdosage: If you think you have taken too much of this medicine contact a poison control center or emergency room at once. NOTE: This medicine is only for you. Do not share this medicine with others. What if I miss a dose? It is important not to miss your dose. Call your care team if you are unable to keep an appointment. What may interact with this medication? Do not take this medication with any of the following: Deferoxamine Dimercaprol Other iron products This medication may also interact with the following: Chloramphenicol Deferasirox This list may not describe all possible interactions. Give your health care provider a list of all the medicines, herbs, non-prescription drugs, or dietary supplements you use. Also tell them if you smoke, drink alcohol, or use illegal drugs. Some items may interact with your medicine. What should I watch for while using this medication? Visit your care team regularly. Tell your care team if your symptoms do not start to get better or if they get worse. You may need blood work done while you are taking this medication. You may need to follow a special diet. Talk to your care team. Foods that contain iron include: whole grains/cereals, dried fruits, beans, or peas, leafy green vegetables, and organ meats (liver, kidney). What side effects may I notice from receiving this medication? Side effects that you should report to your care team as soon as possible: Allergic  reactions--skin rash, itching, hives, swelling of the face, lips, tongue, or throat Low blood pressure--dizziness, feeling faint or lightheaded, blurry vision Shortness of breath Side effects that usually do not require medical attention (report to your care team if they continue or are bothersome): Flushing Headache Joint pain Muscle  pain Nausea Pain, redness, or irritation at injection site This list may not describe all possible side effects. Call your doctor for medical advice about side effects. You may report side effects to FDA at 1-800-FDA-1088. Where should I keep my medication? This medication is given in a hospital or clinic and will not be stored at home. NOTE: This sheet is a summary. It may not cover all possible information. If you have questions about this medicine, talk to your doctor, pharmacist, or health care provider.  2022 Elsevier/Gold Standard (2021-04-10 00:00:00)    To help prevent nausea and vomiting after your treatment, we encourage you to take your nausea medication as directed.  BELOW ARE SYMPTOMS THAT SHOULD BE REPORTED IMMEDIATELY: *FEVER GREATER THAN 100.4 F (38 C) OR HIGHER *CHILLS OR SWEATING *NAUSEA AND VOMITING THAT IS NOT CONTROLLED WITH YOUR NAUSEA MEDICATION *UNUSUAL SHORTNESS OF BREATH *UNUSUAL BRUISING OR BLEEDING *URINARY PROBLEMS (pain or burning when urinating, or frequent urination) *BOWEL PROBLEMS (unusual diarrhea, constipation, pain near the anus) TENDERNESS IN MOUTH AND THROAT WITH OR WITHOUT PRESENCE OF ULCERS (sore throat, sores in mouth, or a toothache) UNUSUAL RASH, SWELLING OR PAIN  UNUSUAL VAGINAL DISCHARGE OR ITCHING   Items with * indicate a potential emergency and should be followed up as soon as possible or go to the Emergency Department if any problems should occur.  Please show the CHEMOTHERAPY ALERT CARD or IMMUNOTHERAPY ALERT CARD at check-in to the Emergency Department and triage nurse.  Should you have questions after your visit or need to cancel or reschedule your appointment, please contact [facility] [place] AT [place]  [phone] and follow the prompts.  Office hours are 8:00 a.m. to 4:30 p.m. Monday - Friday. Please note that voicemails left after 4:00 p.m. may not be returned until the following business day.  We are closed  weekends and major holidays. You have access to a nurse at all times for urgent questions. Please call the main number to the clinic [phone] and follow the prompts.  For any non-urgent questions, you may also contact your provider using MyChart. We now offer e-Visits for anyone 43 and older to request care online for non-urgent symptoms. For details visit [email or web address].   Also download the MyChart app! Go to the app store, search "MyChart", open the app, select [facility], and log in with your MyChart username and password.  Due to Covid, a mask is required upon entering the hospital/clinic. If you do not have a mask, one will be given to you upon arrival. For doctor visits, patients may have 1 support person aged 72 or older with them. For treatment visits, patients cannot have anyone with them due to current Covid guidelines and our immunocompromised population.

## 2022-01-21 ENCOUNTER — Encounter: Payer: Self-pay | Admitting: *Deleted

## 2022-01-22 ENCOUNTER — Encounter
Admission: RE | Disposition: A | Discharge status: Home / Self Care | Payer: Self-pay | Source: Home / Self Care | Attending: Gastroenterology

## 2022-01-22 ENCOUNTER — Ambulatory Visit
Admission: RE | Admit: 2022-01-22 | Discharge: 2022-01-22 | Disposition: A | Discharge status: Home / Self Care | Payer: Medicare HMO | Attending: Gastroenterology | Admitting: Gastroenterology

## 2022-01-22 ENCOUNTER — Encounter: Payer: Self-pay | Admitting: *Deleted

## 2022-01-22 ENCOUNTER — Ambulatory Visit: Payer: Medicare HMO | Admitting: Certified Registered Nurse Anesthetist

## 2022-01-22 ENCOUNTER — Inpatient Hospital Stay: Payer: Medicare HMO

## 2022-01-22 DIAGNOSIS — D509 Iron deficiency anemia, unspecified: Secondary | ICD-10-CM | POA: Diagnosis not present

## 2022-01-22 DIAGNOSIS — K319 Disease of stomach and duodenum, unspecified: Secondary | ICD-10-CM | POA: Insufficient documentation

## 2022-01-22 DIAGNOSIS — F32A Depression, unspecified: Secondary | ICD-10-CM | POA: Insufficient documentation

## 2022-01-22 DIAGNOSIS — K2970 Gastritis, unspecified, without bleeding: Secondary | ICD-10-CM | POA: Diagnosis not present

## 2022-01-22 DIAGNOSIS — I10 Essential (primary) hypertension: Secondary | ICD-10-CM | POA: Diagnosis not present

## 2022-01-22 DIAGNOSIS — K635 Polyp of colon: Secondary | ICD-10-CM | POA: Diagnosis not present

## 2022-01-22 DIAGNOSIS — J449 Chronic obstructive pulmonary disease, unspecified: Secondary | ICD-10-CM | POA: Diagnosis not present

## 2022-01-22 DIAGNOSIS — K5730 Diverticulosis of large intestine without perforation or abscess without bleeding: Secondary | ICD-10-CM | POA: Diagnosis not present

## 2022-01-22 DIAGNOSIS — Z7902 Long term (current) use of antithrombotics/antiplatelets: Secondary | ICD-10-CM | POA: Insufficient documentation

## 2022-01-22 DIAGNOSIS — D123 Benign neoplasm of transverse colon: Secondary | ICD-10-CM | POA: Insufficient documentation

## 2022-01-22 DIAGNOSIS — Z79899 Other long term (current) drug therapy: Secondary | ICD-10-CM | POA: Diagnosis not present

## 2022-01-22 DIAGNOSIS — F419 Anxiety disorder, unspecified: Secondary | ICD-10-CM | POA: Insufficient documentation

## 2022-01-22 DIAGNOSIS — K3189 Other diseases of stomach and duodenum: Secondary | ICD-10-CM | POA: Diagnosis not present

## 2022-01-22 DIAGNOSIS — D126 Benign neoplasm of colon, unspecified: Secondary | ICD-10-CM | POA: Diagnosis not present

## 2022-01-22 DIAGNOSIS — K219 Gastro-esophageal reflux disease without esophagitis: Secondary | ICD-10-CM | POA: Insufficient documentation

## 2022-01-22 DIAGNOSIS — K449 Diaphragmatic hernia without obstruction or gangrene: Secondary | ICD-10-CM | POA: Insufficient documentation

## 2022-01-22 DIAGNOSIS — G4730 Sleep apnea, unspecified: Secondary | ICD-10-CM | POA: Insufficient documentation

## 2022-01-22 DIAGNOSIS — F17200 Nicotine dependence, unspecified, uncomplicated: Secondary | ICD-10-CM | POA: Insufficient documentation

## 2022-01-22 DIAGNOSIS — F418 Other specified anxiety disorders: Secondary | ICD-10-CM | POA: Diagnosis not present

## 2022-01-22 HISTORY — PX: COLONOSCOPY WITH PROPOFOL: SHX5780

## 2022-01-22 HISTORY — PX: ESOPHAGOGASTRODUODENOSCOPY (EGD) WITH PROPOFOL: SHX5813

## 2022-01-22 SURGERY — COLONOSCOPY WITH PROPOFOL
Anesthesia: General

## 2022-01-22 MED ORDER — SODIUM CHLORIDE 0.9 % IV SOLN
INTRAVENOUS | Status: DC
  Administered 2022-01-22: 1000 mL via INTRAVENOUS

## 2022-01-22 MED ORDER — PROPOFOL 500 MG/50ML IV EMUL
INTRAVENOUS | Status: DC | PRN
Start: 2022-01-22 — End: 2022-01-22
  Administered 2022-01-22: 150 ug/kg/min via INTRAVENOUS

## 2022-01-22 MED ORDER — LIDOCAINE HCL (CARDIAC) PF 100 MG/5ML IV SOSY
PREFILLED_SYRINGE | INTRAVENOUS | Status: DC | PRN
  Administered 2022-01-22: 50 mg via INTRAVENOUS

## 2022-01-22 MED ORDER — PROPOFOL 500 MG/50ML IV EMUL
INTRAVENOUS | Status: AC
  Filled 2022-01-22: qty 50

## 2022-01-22 MED ORDER — PHENYLEPHRINE HCL (PRESSORS) 10 MG/ML IV SOLN
INTRAVENOUS | Status: DC | PRN
Start: 2022-01-22 — End: 2022-01-22
  Administered 2022-01-22: 80 ug via INTRAVENOUS

## 2022-01-22 MED ORDER — PROPOFOL 10 MG/ML IV BOLUS
INTRAVENOUS | Status: DC | PRN
  Administered 2022-01-22: 60 mg via INTRAVENOUS
  Administered 2022-01-22: 30 mg via INTRAVENOUS

## 2022-01-22 NOTE — Anesthesia Postprocedure Evaluation (Signed)
Anesthesia Post Note  Patient: Alyssa Watson  Procedure(s) Performed: COLONOSCOPY WITH PROPOFOL ESOPHAGOGASTRODUODENOSCOPY (EGD) WITH PROPOFOL  Patient location during evaluation: Endoscopy Anesthesia Type: General Level of consciousness: awake and alert Pain management: pain level controlled Vital Signs Assessment: post-procedure vital signs reviewed and stable Respiratory status: spontaneous breathing, nonlabored ventilation, respiratory function stable and patient connected to nasal cannula oxygen Cardiovascular status: blood pressure returned to baseline and stable Postop Assessment: no apparent nausea or vomiting Anesthetic complications: no   No notable events documented.   Last Vitals:  Vitals:   01/22/22 1042 01/22/22 1044  BP: 106/71 106/71  Pulse:  77  Resp:  15  Temp: (!) 35.8 C   SpO2: 100% 100%    Last Pain:  Vitals:   01/22/22 1052  TempSrc:   PainSc: 0-No pain                 Precious Haws Noelene Gang

## 2022-01-22 NOTE — Op Note (Signed)
[facility] Gastroenterology Patient Name: Alyssa Watson Procedure Date: 01/22/2022 9:51 AM MRN: [id number] Account #: [id number] Date of Birth: 04-Feb-1956 Admit Type: Outpatient Age: 66 Room: [facility] ENDO ROOM 3 Gender: Female Note Status: Finalized Instrument Name: Colonoscope [id number] Procedure:             Colonoscopy Indications:           Iron deficiency anemia Providers:             Andrey Farmer MD, MD Referring MD:          Leonie Douglas. Doy Hutching, MD (Referring MD) Medicines:             Monitored Anesthesia Care Complications:         No immediate complications. Estimated blood loss:                         Minimal. Procedure:             Pre-Anesthesia Assessment:                        - Prior to the procedure, a History and Physical was                         performed, and patient medications and allergies were                         reviewed. The patient is competent. The risks and                         benefits of the procedure and the sedation options and                         risks were discussed with the patient. All questions                         were answered and informed consent was obtained.                         Patient identification and proposed procedure were                         verified by the physician, the nurse, the                         anesthesiologist, the anesthetist and the technician                         in the endoscopy suite. Mental Status Examination:                         alert and oriented. Airway Examination: normal                         oropharyngeal airway and neck mobility. Respiratory                         Examination: clear to auscultation. CV Examination:  normal. Prophylactic Antibiotics: The patient does not                         require prophylactic antibiotics. Prior                         Anticoagulants: The patient has taken Plavix                          (clopidogrel), last dose was 5 days prior to                         procedure. ASA Grade Assessment: II - A patient with                         mild systemic disease. After reviewing the risks and                         benefits, the patient was deemed in satisfactory                         condition to undergo the procedure. The anesthesia                         plan was to use monitored anesthesia care (MAC).                         Immediately prior to administration of medications,                         the patient was re-assessed for adequacy to receive                         sedatives. The heart rate, respiratory rate, oxygen                         saturations, blood pressure, adequacy of pulmonary                         ventilation, and response to care were monitored                         throughout the procedure. The physical status of the                         patient was re-assessed after the procedure.                        After obtaining informed consent, the colonoscope was                         passed under direct vision. Throughout the procedure,                         the patient's blood pressure, pulse, and oxygen                         saturations were monitored continuously. The  Colonoscope was introduced through the anus and                         advanced to the the terminal ileum. The colonoscopy                         was somewhat difficult due to restricted mobility of                         the colon. Successful completion of the procedure was                         aided by withdrawing the scope and replacing with the                         pediatric colonoscope. The patient tolerated the                         procedure well. The quality of the bowel preparation                         was good. Findings:      The perianal and digital rectal examinations were normal.      The terminal ileum appeared normal.       Multiple small-mouthed diverticula were found in the sigmoid colon,       descending colon, transverse colon and ascending colon.      Two sessile polyps were found in the transverse colon. The polyps were 2       to 3 mm in size. These polyps were removed with a cold snare. Resection       and retrieval were complete. Estimated blood loss was minimal.      The exam was otherwise without abnormality on direct and retroflexion       views. Impression:            - The examined portion of the ileum was normal.                        - Diverticulosis in the sigmoid colon, in the                         descending colon, in the transverse colon and in the                         ascending colon.                        - Two 2 to 3 mm polyps in the transverse colon,                         removed with a cold snare. Resected and retrieved.                        - The examination was otherwise normal on direct and                         retroflexion views. Recommendation:        - Discharge patient to home.                        -  Resume previous diet.                        - Continue present medications.                        - Await pathology results.                        - Repeat colonoscopy for surveillance based on                         pathology results.                        - Return to referring physician as previously                         scheduled. Procedure Code(s):     --- Professional ---                        [phone], Colonoscopy, flexible; with removal of                         tumor(s), polyp(s), or other lesion(s) by snare                         technique Diagnosis Code(s):     --- Professional ---                        K63.5, Polyp of colon                        D50.9, Iron deficiency anemia, unspecified                        K57.30, Diverticulosis of large intestine without                         perforation or abscess without bleeding CPT copyright 2019  American Medical Association. All rights reserved. The codes documented in this report are preliminary and upon coder review may  be revised to meet current compliance requirements. Andrey Farmer MD, MD 01/22/2022 10:46:18 AM Number of Addenda: 0 Note Initiated On: 01/22/2022 9:51 AM Scope Withdrawal Time: 0 hours 16 minutes 3 seconds  Total Procedure Duration: 0 hours 22 minutes 13 seconds  Estimated Blood Loss:  Estimated blood loss was minimal.      [facility]

## 2022-01-22 NOTE — Op Note (Signed)
[facility] Gastroenterology Patient Name: Alyssa Watson Procedure Date: 01/22/2022 9:52 AM MRN: [id number] Account #: [id number] Date of Birth: September 19, 1956 Admit Type: Outpatient Age: 66 Room: [facility] ENDO ROOM 3 Gender: Female Note Status: Finalized Instrument Name: Upper Endoscope [id number] Procedure:             Upper GI endoscopy Indications:           Iron deficiency anemia Providers:             Andrey Farmer MD, MD Referring MD:          Leonie Douglas. Doy Hutching, MD (Referring MD) Medicines:             Monitored Anesthesia Care Complications:         No immediate complications. Estimated blood loss:                         Minimal. Procedure:             Pre-Anesthesia Assessment:                        - Prior to the procedure, a History and Physical was                         performed, and patient medications and allergies were                         reviewed. The patient is competent. The risks and                         benefits of the procedure and the sedation options and                         risks were discussed with the patient. All questions                         were answered and informed consent was obtained.                         Patient identification and proposed procedure were                         verified by the physician, the nurse, the                         anesthesiologist, the anesthetist and the technician                         in the endoscopy suite. Mental Status Examination:                         alert and oriented. Airway Examination: normal                         oropharyngeal airway and neck mobility. Respiratory                         Examination: clear to auscultation. CV Examination:  normal. Prophylactic Antibiotics: The patient does not                         require prophylactic antibiotics. Prior                         Anticoagulants: The patient has taken Plavix                          (clopidogrel), last dose was 5 days prior to                         procedure. ASA Grade Assessment: II - A patient with                         mild systemic disease. After reviewing the risks and                         benefits, the patient was deemed in satisfactory                         condition to undergo the procedure. The anesthesia                         plan was to use monitored anesthesia care (MAC).                         Immediately prior to administration of medications,                         the patient was re-assessed for adequacy to receive                         sedatives. The heart rate, respiratory rate, oxygen                         saturations, blood pressure, adequacy of pulmonary                         ventilation, and response to care were monitored                         throughout the procedure. The physical status of the                         patient was re-assessed after the procedure.                        After obtaining informed consent, the endoscope was                         passed under direct vision. Throughout the procedure,                         the patient's blood pressure, pulse, and oxygen                         saturations were monitored continuously. The  Endosonoscope was introduced through the mouth, and                         advanced to the second part of duodenum. The upper GI                         endoscopy was accomplished without difficulty. The                         patient tolerated the procedure well. Findings:      A small hiatal hernia was present.      The exam of the esophagus was otherwise normal.      The entire examined stomach was normal. Biopsies were taken with a cold       forceps for Helicobacter pylori testing. Estimated blood loss was       minimal.      The examined duodenum was normal. Biopsies for histology were taken with       a cold forceps for evaluation of celiac  disease. Estimated blood loss       was minimal. Impression:            - Small hiatal hernia.                        - Normal stomach. Biopsied.                        - Normal examined duodenum. Biopsied. Recommendation:        - Discharge patient to home.                        - Resume previous diet.                        - Continue present medications.                        - Await pathology results.                        - Perform a colonoscopy today. Procedure Code(s):     --- Professional ---                        [id number], Esophagogastroduodenoscopy, flexible,                         transoral; with biopsy, single or multiple Diagnosis Code(s):     --- Professional ---                        K44.9, Diaphragmatic hernia without obstruction or                         gangrene                        D50.9, Iron deficiency anemia, unspecified CPT copyright 2019 American Medical Association. All rights reserved. The codes documented in this report are preliminary and upon coder review may  be revised to meet current compliance requirements. Andrey Farmer MD, MD 01/22/2022 10:43:04 AM Number of Addenda: 0 Note Initiated On: 01/22/2022  9:52 AM Total Procedure Duration: 0 hours 5 minutes 57 seconds  Estimated Blood Loss:  Estimated blood loss was minimal.      [facility]

## 2022-01-22 NOTE — Interval H&P Note (Signed)
History and Physical Interval Note:  01/22/2022 9:52 AM  Alyssa Watson  has presented today for surgery, with the diagnosis of D50.9 - Iron deficiency anemia, unspecified iron deficiency anemia type K21.9- Gastroesophageal reflux disease, unspecified whether esophagitis present R13.10  - Pill dysphagia, unspecified.  The various methods of treatment have been discussed with the patient and family. After consideration of risks, benefits and other options for treatment, the patient has consented to  Procedure(s): COLONOSCOPY WITH PROPOFOL (N/A) ESOPHAGOGASTRODUODENOSCOPY (EGD) WITH PROPOFOL (N/A) as a surgical intervention.  The patient's history has been reviewed, patient examined, no change in status, stable for surgery.  I have reviewed the patient's chart and labs.  Questions were answered to the patient's satisfaction.     Lesly Rubenstein  Ok to proceed with EGD/Colonoscopy

## 2022-01-22 NOTE — Anesthesia Preprocedure Evaluation (Signed)
Anesthesia Evaluation  Patient identified by MRN, date of birth, ID band Patient awake    Reviewed: Allergy & Precautions, NPO status , Patient's Chart, lab work & pertinent test results  History of Anesthesia Complications Negative for: history of anesthetic complications  Airway Mallampati: III  TM Distance: >3 FB Neck ROM: full    Dental  (+) Poor Dentition, Missing   Pulmonary sleep apnea , COPD, Current Smoker and Patient abstained from smoking.,    Pulmonary exam normal        Cardiovascular hypertension, (-) angina+ CAD and + Cardiac Stents  Normal cardiovascular exam     Neuro/Psych  Headaches, PSYCHIATRIC DISORDERS    GI/Hepatic negative GI ROS, Neg liver ROS, neg GERD  ,  Endo/Other  negative endocrine ROS  Renal/GU negative Renal ROS  negative genitourinary   Musculoskeletal   Abdominal   Peds  Hematology negative hematology ROS (+)   Anesthesia Other Findings Past Medical History: No date: Anemia No date: Anxiety and depression No date: H/O carotid endarterectomy 03/2014: H/O uterine prolapse No date: Heart disease     Comment:  h/o angioplasty No date: Hyperlipemia 01/04/2022: IDA (iron deficiency anemia) No date: Rash of face No date: Sleep apnea No date: Vaginal enterocele     Comment:  1st degree cystocele  Past Surgical History: 05/2013: ANGIOPLASTY     Comment:  stent placed No date: CARPAL TUNNEL RELEASE; Left No date: CORONARY ANGIOPLASTY WITH STENT PLACEMENT 03/2014: VAGINAL HYSTERECTOMY     Comment:  with BSO  BMI    Body Mass Index: 20.49 kg/m      Reproductive/Obstetrics negative OB ROS                             Anesthesia Physical Anesthesia Plan  ASA: 3  Anesthesia Plan: General   Post-op Pain Management:    Induction: Intravenous  PONV Risk Score and Plan: Propofol infusion and TIVA  Airway Management Planned: Natural Airway and Nasal  Cannula  Additional Equipment:   Intra-op Plan:   Post-operative Plan:   Informed Consent: I have reviewed the patients History and Physical, chart, labs and discussed the procedure including the risks, benefits and alternatives for the proposed anesthesia with the patient or authorized representative who has indicated his/her understanding and acceptance.     Dental Advisory Given  Plan Discussed with: Anesthesiologist, CRNA and Surgeon  Anesthesia Plan Comments: (Patient consented for risks of anesthesia including but not limited to:  - adverse reactions to medications - risk of airway placement if required - damage to eyes, teeth, lips or other oral mucosa - nerve damage due to positioning  - sore throat or hoarseness - Damage to heart, brain, nerves, lungs, other parts of body or loss of life  Patient voiced understanding.)        Anesthesia Quick Evaluation

## 2022-01-22 NOTE — H&P (Signed)
Outpatient short stay form Pre-procedure 01/22/2022  Alyssa Rubenstein, MD  Primary Physician: Idelle Crouch, MD  Reason for visit:  IDA  History of present illness:    66 y/o lady with PMH of PVD, HLD, and new onset IDA here for EGD/Colonoscopy. Takes plavix with last dose 5 days ago. History of hysterectomy but no other abdominal surgeries. No known family history of GI malignancies. No overt GI bleeding. Occasional smoker.    Current Facility-Administered Medications:    0.9 %  sodium chloride infusion, , Intravenous, Continuous, Aubrynn Katona, Hilton Cork, MD, Last Rate: 20 mL/hr at 01/22/22 0939, 1,000 mL at 01/22/22 0939  Medications Prior to Admission  Medication Sig Dispense Refill Last Dose   ALPRAZolam (XANAX) 1 MG tablet    01/21/2022   cetirizine (ZYRTEC) 10 MG tablet Take 10 mg by mouth daily.   01/21/2022   gabapentin (NEURONTIN) 100 MG capsule Take 100 mg by mouth daily.   Past Week   olopatadine (PATANOL) 0.1 % ophthalmic solution INSTILL 1 DROP INTO EACH AFFECTED EYE TWICE DAILY   01/21/2022   rosuvastatin (CRESTOR) 40 MG tablet Take 40 mg by mouth daily.   01/21/2022   sertraline (ZOLOFT) 25 MG tablet Take 100 mg by mouth daily.    01/21/2022   aspirin EC 81 MG tablet Take 81 mg by mouth daily. (Patient not taking: Reported on 01/04/2022)      atorvastatin (LIPITOR) 80 MG tablet Take 80 mg by mouth daily.      azithromycin (ZITHROMAX) 250 MG tablet  (Patient not taking: Reported on 01/22/2022)   Completed Course   clopidogrel (PLAVIX) 75 MG tablet Take 75 mg by mouth daily.   01/16/2022   doxycycline (VIBRA-TABS) 100 MG tablet  (Patient not taking: Reported on 01/22/2022)   Completed Course   ferrous sulfate 325 (65 FE) MG EC tablet Take by mouth. (Patient not taking: Reported on 01/22/2022)   Completed Course   Vitamin D, Ergocalciferol, (DRISDOL) 1.25 MG (50000 UT) CAPS capsule Take 50,000 Units by mouth once a week.        Allergies  Allergen Reactions   Azelastine Other  (See Comments)    Throat swelling   Fluticasone Other (See Comments)    Throat swelling   Codeine Rash   Penicillins Rash    Did it involve swelling of the face/tongue/throat, SOB, or low BP? No Did it involve sudden or severe rash/hives, skin peeling, or any reaction on the inside of your mouth or nose? No Did you need to seek medical attention at a hospital or doctor's office? No When did it last happen?      20 years ago If all above answers are NO, may proceed with cephalosporin use.     Past Medical History:  Diagnosis Date   Anemia    Anxiety and depression    H/O carotid endarterectomy    H/O uterine prolapse 03/2014   Heart disease    h/o angioplasty   Hyperlipemia    IDA (iron deficiency anemia) 01/04/2022   Rash of face    Sleep apnea    Vaginal enterocele    1st degree cystocele    Review of systems:  Otherwise negative.    Physical Exam  Gen: Alert, oriented. Appears stated age.  HEENT: PERRLA. Lungs: No respiratory distress CV: RRR Abd: soft, benign, no masses Ext: No edema    Planned procedures: Proceed with colonoscopy. The patient understands the nature of the planned procedure, indications, risks, alternatives and  potential complications including but not limited to bleeding, infection, perforation, damage to internal organs and possible oversedation/side effects from anesthesia. The patient agrees and gives consent to proceed.  Please refer to procedure notes for findings, recommendations and patient disposition/instructions.     Alyssa Rubenstein, MD [facility]

## 2022-01-22 NOTE — Anesthesia Procedure Notes (Signed)
Date/Time: 01/22/2022 9:51 AM Performed by: Johnna Acosta, CRNA Pre-anesthesia Checklist: Patient identified, Emergency Drugs available, Suction available, Patient being monitored and Timeout performed Patient Re-evaluated:Patient Re-evaluated prior to induction Oxygen Delivery Method: Nasal cannula Preoxygenation: Pre-oxygenation with 100% oxygen Induction Type: IV induction

## 2022-01-22 NOTE — Transfer of Care (Signed)
Immediate Anesthesia Transfer of Care Note  Patient: Alyssa Watson  Procedure(s) Performed: COLONOSCOPY WITH PROPOFOL ESOPHAGOGASTRODUODENOSCOPY (EGD) WITH PROPOFOL  Patient Location: PACU  Anesthesia Type:General  Level of Consciousness: awake, alert  and oriented  Airway & Oxygen Therapy: Patient Spontanous Breathing  Post-op Assessment: Report given to RN and Post -op Vital signs reviewed and stable  Post vital signs: Reviewed and stable  Last Vitals:  Vitals Value Taken Time  BP 106/71 01/22/22 1044  Temp 35.8 C 01/22/22 1042  Pulse 75 01/22/22 1044  Resp 13 01/22/22 1044  SpO2 90 % 01/22/22 1044  Vitals shown include unvalidated device data.  Last Pain:  Vitals:   01/22/22 1042  TempSrc: Temporal  PainSc: 0-No pain         Complications: No notable events documented.

## 2022-01-25 ENCOUNTER — Encounter: Payer: Self-pay | Admitting: Gastroenterology

## 2022-01-25 LAB — SURGICAL PATHOLOGY

## 2022-01-26 DIAGNOSIS — E611 Iron deficiency: Secondary | ICD-10-CM | POA: Diagnosis not present

## 2022-01-28 ENCOUNTER — Inpatient Hospital Stay: Payer: Medicare HMO | Attending: Oncology

## 2022-01-28 ENCOUNTER — Other Ambulatory Visit: Payer: Self-pay

## 2022-01-28 VITALS — BP 125/66 | HR 69 | Temp 98.3°F | Resp 16

## 2022-01-28 DIAGNOSIS — Z79899 Other long term (current) drug therapy: Secondary | ICD-10-CM | POA: Insufficient documentation

## 2022-01-28 DIAGNOSIS — D509 Iron deficiency anemia, unspecified: Secondary | ICD-10-CM | POA: Insufficient documentation

## 2022-01-28 MED ORDER — IRON SUCROSE 20 MG/ML IV SOLN
200.0000 mg | Freq: Once | INTRAVENOUS | Status: AC
  Administered 2022-01-28: 200 mg via INTRAVENOUS
  Filled 2022-01-28: qty 10

## 2022-01-28 MED ORDER — SODIUM CHLORIDE 0.9 % IV SOLN
Freq: Once | INTRAVENOUS | Status: AC
  Filled 2022-01-28: qty 250

## 2022-01-28 MED ORDER — SODIUM CHLORIDE 0.9 % IV SOLN: 200.0000 mg | Freq: Once | INTRAVENOUS | Status: DC

## 2022-01-29 ENCOUNTER — Ambulatory Visit: Payer: Medicare HMO

## 2022-02-05 ENCOUNTER — Inpatient Hospital Stay: Payer: Medicare HMO

## 2022-02-05 ENCOUNTER — Other Ambulatory Visit: Payer: Self-pay

## 2022-02-05 VITALS — BP 128/66 | HR 68 | Temp 97.5°F | Resp 16

## 2022-02-05 DIAGNOSIS — Z79899 Other long term (current) drug therapy: Secondary | ICD-10-CM | POA: Diagnosis not present

## 2022-02-05 DIAGNOSIS — D509 Iron deficiency anemia, unspecified: Secondary | ICD-10-CM

## 2022-02-05 MED ORDER — IRON SUCROSE 20 MG/ML IV SOLN
200.0000 mg | Freq: Once | INTRAVENOUS | Status: AC
  Administered 2022-02-05: 200 mg via INTRAVENOUS
  Filled 2022-02-05: qty 10

## 2022-02-05 MED ORDER — SODIUM CHLORIDE 0.9 % IV SOLN: 200.0000 mg | Freq: Once | INTRAVENOUS | Status: DC

## 2022-02-05 MED ORDER — SODIUM CHLORIDE 0.9 % IV SOLN
Freq: Once | INTRAVENOUS | Status: AC
  Filled 2022-02-05: qty 250

## 2022-02-13 DIAGNOSIS — H25813 Combined forms of age-related cataract, bilateral: Secondary | ICD-10-CM | POA: Diagnosis not present

## 2022-02-13 DIAGNOSIS — E119 Type 2 diabetes mellitus without complications: Secondary | ICD-10-CM | POA: Diagnosis not present

## 2022-02-13 DIAGNOSIS — H5203 Hypermetropia, bilateral: Secondary | ICD-10-CM | POA: Diagnosis not present

## 2022-02-16 DIAGNOSIS — C44622 Squamous cell carcinoma of skin of right upper limb, including shoulder: Secondary | ICD-10-CM | POA: Diagnosis not present

## 2022-03-02 DIAGNOSIS — L570 Actinic keratosis: Secondary | ICD-10-CM | POA: Diagnosis not present

## 2022-03-02 DIAGNOSIS — L298 Other pruritus: Secondary | ICD-10-CM | POA: Diagnosis not present

## 2022-03-02 DIAGNOSIS — L905 Scar conditions and fibrosis of skin: Secondary | ICD-10-CM | POA: Diagnosis not present

## 2022-03-02 DIAGNOSIS — L821 Other seborrheic keratosis: Secondary | ICD-10-CM | POA: Diagnosis not present

## 2022-03-10 DIAGNOSIS — J019 Acute sinusitis, unspecified: Secondary | ICD-10-CM | POA: Diagnosis not present

## 2022-03-10 DIAGNOSIS — J302 Other seasonal allergic rhinitis: Secondary | ICD-10-CM | POA: Diagnosis not present

## 2022-03-10 DIAGNOSIS — Z720 Tobacco use: Secondary | ICD-10-CM | POA: Diagnosis not present

## 2022-03-26 DIAGNOSIS — F419 Anxiety disorder, unspecified: Secondary | ICD-10-CM | POA: Diagnosis not present

## 2022-03-26 DIAGNOSIS — E785 Hyperlipidemia, unspecified: Secondary | ICD-10-CM | POA: Diagnosis not present

## 2022-03-26 DIAGNOSIS — Z0000 Encounter for general adult medical examination without abnormal findings: Secondary | ICD-10-CM | POA: Diagnosis not present

## 2022-03-26 DIAGNOSIS — E559 Vitamin D deficiency, unspecified: Secondary | ICD-10-CM | POA: Diagnosis not present

## 2022-03-26 DIAGNOSIS — I10 Essential (primary) hypertension: Secondary | ICD-10-CM | POA: Diagnosis not present

## 2022-03-26 DIAGNOSIS — Z79899 Other long term (current) drug therapy: Secondary | ICD-10-CM | POA: Diagnosis not present

## 2022-03-26 DIAGNOSIS — I2510 Atherosclerotic heart disease of native coronary artery without angina pectoris: Secondary | ICD-10-CM | POA: Diagnosis not present

## 2022-03-26 DIAGNOSIS — Z1231 Encounter for screening mammogram for malignant neoplasm of breast: Secondary | ICD-10-CM | POA: Diagnosis not present

## 2022-03-26 DIAGNOSIS — Z720 Tobacco use: Secondary | ICD-10-CM | POA: Diagnosis not present

## 2022-03-29 ENCOUNTER — Other Ambulatory Visit: Payer: Self-pay | Admitting: Internal Medicine

## 2022-03-29 DIAGNOSIS — Z1231 Encounter for screening mammogram for malignant neoplasm of breast: Secondary | ICD-10-CM

## 2022-03-30 ENCOUNTER — Telehealth: Payer: Self-pay | Admitting: *Deleted

## 2022-03-30 NOTE — Telephone Encounter (Signed)
Will you please r/s labs to be 1-2 days prior to new MD visit date ?

## 2022-03-30 NOTE — Telephone Encounter (Signed)
Dr. Tasia Catchings advises to strongly keep appt with Dr. Tasia Catchings as well to discuss the need for further iron management. If she does not wish to follow up with Dr. Tasia Catchings then we can cancel all appts and she may follow up with PCP for further management.  ?

## 2022-03-30 NOTE — Telephone Encounter (Signed)
Patient called stating she wants to cancel Fridays appointment, but plans on keeping her appointment on Thursday for lab ?

## 2022-03-30 NOTE — Telephone Encounter (Signed)
Patient called and said she needs to reschedule Fridays appointment as she is going to be out of town. Please call her to reschedule appointment. ?

## 2022-04-01 ENCOUNTER — Inpatient Hospital Stay: Payer: Medicare HMO

## 2022-04-02 ENCOUNTER — Inpatient Hospital Stay: Payer: Medicare HMO

## 2022-04-02 ENCOUNTER — Inpatient Hospital Stay: Payer: Medicare HMO | Admitting: Oncology

## 2022-04-21 ENCOUNTER — Encounter: Payer: Self-pay | Admitting: Oncology

## 2022-04-21 ENCOUNTER — Inpatient Hospital Stay: Payer: Medicare HMO | Attending: Oncology

## 2022-04-21 DIAGNOSIS — K449 Diaphragmatic hernia without obstruction or gangrene: Secondary | ICD-10-CM | POA: Insufficient documentation

## 2022-04-21 DIAGNOSIS — Z90722 Acquired absence of ovaries, bilateral: Secondary | ICD-10-CM | POA: Diagnosis not present

## 2022-04-21 DIAGNOSIS — D509 Iron deficiency anemia, unspecified: Secondary | ICD-10-CM | POA: Insufficient documentation

## 2022-04-21 DIAGNOSIS — Z79899 Other long term (current) drug therapy: Secondary | ICD-10-CM | POA: Diagnosis not present

## 2022-04-21 DIAGNOSIS — R6339 Other feeding difficulties: Secondary | ICD-10-CM | POA: Diagnosis not present

## 2022-04-21 DIAGNOSIS — R5383 Other fatigue: Secondary | ICD-10-CM | POA: Insufficient documentation

## 2022-04-21 DIAGNOSIS — Z885 Allergy status to narcotic agent status: Secondary | ICD-10-CM | POA: Insufficient documentation

## 2022-04-21 DIAGNOSIS — K5730 Diverticulosis of large intestine without perforation or abscess without bleeding: Secondary | ICD-10-CM | POA: Diagnosis not present

## 2022-04-21 DIAGNOSIS — Z880 Allergy status to penicillin: Secondary | ICD-10-CM | POA: Diagnosis not present

## 2022-04-21 DIAGNOSIS — Z8249 Family history of ischemic heart disease and other diseases of the circulatory system: Secondary | ICD-10-CM | POA: Diagnosis not present

## 2022-04-21 DIAGNOSIS — Z836 Family history of other diseases of the respiratory system: Secondary | ICD-10-CM | POA: Diagnosis not present

## 2022-04-21 DIAGNOSIS — F17210 Nicotine dependence, cigarettes, uncomplicated: Secondary | ICD-10-CM | POA: Diagnosis not present

## 2022-04-21 LAB — CBC WITH DIFFERENTIAL/PLATELET
Abs Immature Granulocytes: 0.02 10*3/uL (ref 0.00–0.07)
Basophils Absolute: 0 10*3/uL (ref 0.0–0.1)
Basophils Relative: 1 %
Eosinophils Absolute: 0 10*3/uL (ref 0.0–0.5)
Eosinophils Relative: 1 %
HCT: 39.5 % (ref 36.0–46.0)
Hemoglobin: 13.2 g/dL (ref 12.0–15.0)
Immature Granulocytes: 0 %
Lymphocytes Relative: 21 %
Lymphs Abs: 1.1 10*3/uL (ref 0.7–4.0)
MCH: 30.6 pg (ref 26.0–34.0)
MCHC: 33.4 g/dL (ref 30.0–36.0)
MCV: 91.6 fL (ref 80.0–100.0)
Monocytes Absolute: 0.4 10*3/uL (ref 0.1–1.0)
Monocytes Relative: 7 %
Neutro Abs: 3.7 10*3/uL (ref 1.7–7.7)
Neutrophils Relative %: 70 %
Platelets: 228 10*3/uL (ref 150–400)
RBC: 4.31 MIL/uL (ref 3.87–5.11)
RDW: 13.6 % (ref 11.5–15.5)
WBC: 5.3 10*3/uL (ref 4.0–10.5)
nRBC: 0 % (ref 0.0–0.2)

## 2022-04-21 LAB — IRON AND TIBC
Iron: 66 ug/dL (ref 28–170)
Saturation Ratios: 16 % (ref 10.4–31.8)
TIBC: 420 ug/dL (ref 250–450)
UIBC: 354 ug/dL

## 2022-04-21 LAB — FERRITIN: Ferritin: 15 ng/mL (ref 11–307)

## 2022-04-23 ENCOUNTER — Inpatient Hospital Stay (HOSPITAL_BASED_OUTPATIENT_CLINIC_OR_DEPARTMENT_OTHER): Payer: Medicare HMO | Admitting: Oncology

## 2022-04-23 ENCOUNTER — Inpatient Hospital Stay: Payer: Medicare HMO

## 2022-04-23 ENCOUNTER — Encounter: Payer: Self-pay | Admitting: Oncology

## 2022-04-23 VITALS — BP 132/69 | HR 62 | Temp 95.6°F | Resp 16 | Ht 61.5 in | Wt 108.0 lb

## 2022-04-23 DIAGNOSIS — K5730 Diverticulosis of large intestine without perforation or abscess without bleeding: Secondary | ICD-10-CM | POA: Diagnosis not present

## 2022-04-23 DIAGNOSIS — K449 Diaphragmatic hernia without obstruction or gangrene: Secondary | ICD-10-CM | POA: Diagnosis not present

## 2022-04-23 DIAGNOSIS — D509 Iron deficiency anemia, unspecified: Secondary | ICD-10-CM | POA: Diagnosis not present

## 2022-04-23 DIAGNOSIS — Z885 Allergy status to narcotic agent status: Secondary | ICD-10-CM | POA: Diagnosis not present

## 2022-04-23 DIAGNOSIS — F17210 Nicotine dependence, cigarettes, uncomplicated: Secondary | ICD-10-CM | POA: Diagnosis not present

## 2022-04-23 DIAGNOSIS — R5383 Other fatigue: Secondary | ICD-10-CM | POA: Diagnosis not present

## 2022-04-23 DIAGNOSIS — Z880 Allergy status to penicillin: Secondary | ICD-10-CM | POA: Diagnosis not present

## 2022-04-23 DIAGNOSIS — Z79899 Other long term (current) drug therapy: Secondary | ICD-10-CM | POA: Diagnosis not present

## 2022-04-23 DIAGNOSIS — R6339 Other feeding difficulties: Secondary | ICD-10-CM | POA: Diagnosis not present

## 2022-04-24 ENCOUNTER — Encounter: Payer: Self-pay | Admitting: Oncology

## 2022-04-24 NOTE — Progress Notes (Signed)
Hematology/Oncology Consult note [phone] [phone]         Patient Care Team: Idelle Crouch, MD as PCP - General (Internal Medicine) Earlie Server, MD as Consulting Physician (Hematology)  REFERRING PROVIDER: Idelle Crouch, MD  CHIEF COMPLAINTS/REASON FOR VISIT:  Follow up for iron deficiency anemia.   HISTORY OF PRESENTING ILLNESS:   Alyssa Watson is a  66 y.o.  female with PMH listed below was seen in consultation at the request of  Idelle Crouch, MD  for evaluation of anemia  08/07/2022, CBC showed a hemoglobin of 11.1.  MCV 87.9. 12/10/2020, hemoglobin was decreased to 8.6, MCV 81.7, 12/25/2020, repeat CBC showed a hemoglobin of 8.7, MCV 79.5, iron saturation 4, ferritin 10.  Patient was recommended to take oral iron supplementation 3 times a day.  She has started for about a week overall tolerates well.  She noticed that her stool has turned black.  She did Denies any vaginal bleeding, black tarry stools or blood in the stool.  She reports feeling tired.  No pica symptoms. Patient has never had a colonoscopy.  Patient denies any family history of colon cancer. Patient reports that she is a picky eater.  Diet is not very good.  She has lost 2 to 3 pounds since April 2022.  She denies any abdominal pain, nausea vomiting diarrhea.  INTERVAL HISTORY Alyssa Watson is a 66 y.o. female who has above history reviewed by me today presents for follow up visit for IDA She has had IV venofer treatments and tolerated well.  Today she feels well. No new complaints.  01/22/22 EGD showed small hiatal hernia, normal examination.  Stomach biopsy showed reactive gastropathy with focal interstitial metaplasia.  Duodenum biopsy negative. Colonoscopy Diverticulosis in the sigmoid colon, in the descending colon, in the transverse colon and in the ascending colon. - Two 2 to 3 mm polyps in the transverse colon, removed with a cold snare. Resected and  retrieved.-Pathology showed tubular adenoma and sessile serrated polyp without dysplasia.  Review of Systems  Constitutional:  Positive for fatigue. Negative for appetite change, chills and fever.  HENT:   Negative for hearing loss and voice change.   Eyes:  Negative for eye problems.  Respiratory:  Negative for chest tightness and cough.   Cardiovascular:  Negative for chest pain.  Gastrointestinal:  Negative for abdominal distention, abdominal pain and blood in stool.  Endocrine: Negative for hot flashes.  Genitourinary:  Negative for difficulty urinating and frequency.   Musculoskeletal:  Negative for arthralgias.  Skin:  Negative for itching and rash.  Neurological:  Negative for extremity weakness.  Hematological:  Negative for adenopathy.  Psychiatric/Behavioral:  Negative for confusion.    MEDICAL HISTORY:  Past Medical History:  Diagnosis Date   Anemia    Anxiety and depression    H/O carotid endarterectomy    H/O uterine prolapse 03/2014   Heart disease    h/o angioplasty   Hyperlipemia    IDA (iron deficiency anemia) 01/04/2022   Rash of face    Sleep apnea    Vaginal enterocele    1st degree cystocele    SURGICAL HISTORY: Past Surgical History:  Procedure Laterality Date   ANGIOPLASTY  05/2013   stent placed   CARPAL TUNNEL RELEASE Left    COLONOSCOPY WITH PROPOFOL N/A 01/22/2022   Procedure: COLONOSCOPY WITH PROPOFOL;  Surgeon: Lesly Rubenstein, MD;  Location: [facility] ENDOSCOPY;  Service: Endoscopy;  Laterality: N/A;   CORONARY ANGIOPLASTY WITH  STENT PLACEMENT     ESOPHAGOGASTRODUODENOSCOPY (EGD) WITH PROPOFOL N/A 01/22/2022   Procedure: ESOPHAGOGASTRODUODENOSCOPY (EGD) WITH PROPOFOL;  Surgeon: Lesly Rubenstein, MD;  Location: [facility] ENDOSCOPY;  Service: Endoscopy;  Laterality: N/A;   VAGINAL HYSTERECTOMY  03/2014   with BSO    SOCIAL HISTORY: Social History   Socioeconomic History   Marital status: Married    Spouse name: Not on file   Number of  children: Not on file   Years of education: Not on file   Highest education level: Not on file  Occupational History   Not on file  Tobacco Use   Smoking status: Every Day    Packs/day: 0.50    Years: 33.00    Pack years: 16.50    Types: Cigarettes    Passive exposure: Never   Smokeless tobacco: Never  Vaping Use   Vaping Use: Never used  Substance and Sexual Activity   Alcohol use: No    Alcohol/week: 0.0 standard drinks   Drug use: No   Sexual activity: Yes    Birth control/protection: None  Other Topics Concern   Not on file  Social History Narrative   Not on file   Social Determinants of Health   Financial Resource Strain: Not on file  Food Insecurity: Not on file  Transportation Needs: Not on file  Physical Activity: Not on file  Stress: Not on file  Social Connections: Not on file  Intimate Partner Violence: Not on file    FAMILY HISTORY: Family History  Problem Relation Age of Onset   Heart disease Mother    COPD Mother     ALLERGIES:  is allergic to azelastine, fluticasone, codeine, and penicillins.  MEDICATIONS:  Current Outpatient Medications  Medication Sig Dispense Refill   ALPRAZolam (XANAX) 1 MG tablet      amLODipine (NORVASC) 5 MG tablet Take 5 mg by mouth daily.     atorvastatin (LIPITOR) 80 MG tablet Take 80 mg by mouth daily.     cetirizine (ZYRTEC) 10 MG tablet Take 10 mg by mouth daily.     clopidogrel (PLAVIX) 75 MG tablet Take 75 mg by mouth daily.     ezetimibe (ZETIA) 10 MG tablet Take 1 tablet by mouth daily.     gabapentin (NEURONTIN) 100 MG capsule Take 100 mg by mouth daily.     pantoprazole (PROTONIX) 40 MG tablet Take by mouth.     propranolol (INDERAL) 20 MG tablet Take 1 tablet by mouth 2 (two) times daily.     sertraline (ZOLOFT) 100 MG tablet Take by mouth.     SUMAtriptan (IMITREX) 100 MG tablet Take by mouth.     Vitamin D, Ergocalciferol, (DRISDOL) 1.25 MG (50000 UT) CAPS capsule Take 50,000 Units by mouth once a week.      aspirin EC 81 MG tablet Take 81 mg by mouth daily. (Patient not taking: Reported on 01/04/2022)     azithromycin (ZITHROMAX) 250 MG tablet  (Patient not taking: Reported on 01/22/2022)     doxycycline (VIBRA-TABS) 100 MG tablet  (Patient not taking: Reported on 01/22/2022)     olopatadine (PATANOL) 0.1 % ophthalmic solution INSTILL 1 DROP INTO EACH AFFECTED EYE TWICE DAILY (Patient not taking: Reported on 04/23/2022)     rosuvastatin (CRESTOR) 40 MG tablet Take 40 mg by mouth daily. (Patient not taking: Reported on 04/23/2022)     No current facility-administered medications for this visit.     PHYSICAL EXAMINATION: ECOG PERFORMANCE STATUS: 1 - Symptomatic but completely  ambulatory Vitals:   04/23/22 1146  BP: 132/69  Pulse: 62  Resp: 16  Temp: (!) 95.6 F (35.3 C)  SpO2: 99%   Filed Weights   04/23/22 1146  Weight: 108 lb (49 kg)    Physical Exam Constitutional:      General: She is not in acute distress.    Comments: Patient ambulates independently  HENT:     Head: Normocephalic and atraumatic.  Eyes:     General: No scleral icterus. Cardiovascular:     Rate and Rhythm: Normal rate.     Heart sounds: Normal heart sounds.  Pulmonary:     Effort: Pulmonary effort is normal. No respiratory distress.     Breath sounds: No wheezing.  Abdominal:     General: There is no distension.  Musculoskeletal:        General: No deformity. Normal range of motion.     Cervical back: Normal range of motion and neck supple.  Skin:    General: Skin is warm and dry.     Coloration: Skin is not pale.     Findings: No rash.  Neurological:     Mental Status: She is alert and oriented to person, place, and time. Mental status is at baseline.     Cranial Nerves: No cranial nerve deficit.     Coordination: Coordination normal.  Psychiatric:        Mood and Affect: Mood normal.    LABORATORY DATA:  I have reviewed the data as listed Lab Results  Component Value Date   WBC 5.3  04/21/2022   HGB 13.2 04/21/2022   HCT 39.5 04/21/2022   MCV 91.6 04/21/2022   PLT 228 04/21/2022   No results for input(s): NA, K, CL, CO2, GLUCOSE, BUN, CREATININE, CALCIUM, GFRNONAA, GFRAA, PROT, ALBUMIN, AST, ALT, ALKPHOS, BILITOT, BILIDIR, IBILI in the last 8760 hours. Iron/TIBC/Ferritin/ %Sat    Component Value Date/Time   IRON 66 04/21/2022 1242   TIBC 420 04/21/2022 1242   FERRITIN 15 04/21/2022 1242   IRONPCTSAT 16 04/21/2022 1242      RADIOGRAPHIC STUDIES: I have personally reviewed the radiological images as listed and agreed with the findings in the report. No results found.    ASSESSMENT & PLAN:  1. Iron deficiency anemia, unspecified iron deficiency anemia type    #Iron deficiency anemia,  Labs are reviewed and discussed with patient. Hemoglobin and iron panel have both improved.  No need for additional IV venofer.  She can not tolerate oral iron supplementation.  Recommend patient to take iron rich food.   Recommend patient to continue follow-up with primary care provider.  She does not need to actively follow-up with hematology.  She can reestablish care in the future if clinically indicated.  She agrees with the plan. All questions were answered. The patient knows to call the clinic with any problems questions or concerns.  cc Idelle Crouch, MD    Earlie Server, MD, PhD [facility] Hematology Oncology 04/24/2022

## 2022-06-21 DIAGNOSIS — E7800 Pure hypercholesterolemia, unspecified: Secondary | ICD-10-CM | POA: Diagnosis not present

## 2022-06-21 DIAGNOSIS — I10 Essential (primary) hypertension: Secondary | ICD-10-CM | POA: Diagnosis not present

## 2022-06-21 DIAGNOSIS — R7309 Other abnormal glucose: Secondary | ICD-10-CM | POA: Diagnosis not present

## 2022-06-21 DIAGNOSIS — Z79899 Other long term (current) drug therapy: Secondary | ICD-10-CM | POA: Diagnosis not present

## 2022-06-23 DIAGNOSIS — D485 Neoplasm of uncertain behavior of skin: Secondary | ICD-10-CM | POA: Diagnosis not present

## 2022-06-23 DIAGNOSIS — C44529 Squamous cell carcinoma of skin of other part of trunk: Secondary | ICD-10-CM | POA: Diagnosis not present

## 2022-06-28 DIAGNOSIS — E785 Hyperlipidemia, unspecified: Secondary | ICD-10-CM | POA: Diagnosis not present

## 2022-06-28 DIAGNOSIS — F17200 Nicotine dependence, unspecified, uncomplicated: Secondary | ICD-10-CM | POA: Diagnosis not present

## 2022-06-28 DIAGNOSIS — I2510 Atherosclerotic heart disease of native coronary artery without angina pectoris: Secondary | ICD-10-CM | POA: Diagnosis not present

## 2022-06-28 DIAGNOSIS — F419 Anxiety disorder, unspecified: Secondary | ICD-10-CM | POA: Diagnosis not present

## 2022-06-28 DIAGNOSIS — E559 Vitamin D deficiency, unspecified: Secondary | ICD-10-CM | POA: Diagnosis not present

## 2022-06-28 DIAGNOSIS — Z0000 Encounter for general adult medical examination without abnormal findings: Secondary | ICD-10-CM | POA: Diagnosis not present

## 2022-06-28 DIAGNOSIS — J449 Chronic obstructive pulmonary disease, unspecified: Secondary | ICD-10-CM | POA: Diagnosis not present

## 2022-06-28 DIAGNOSIS — I10 Essential (primary) hypertension: Secondary | ICD-10-CM | POA: Diagnosis not present

## 2022-06-28 DIAGNOSIS — R519 Headache, unspecified: Secondary | ICD-10-CM | POA: Diagnosis not present

## 2022-06-29 ENCOUNTER — Other Ambulatory Visit: Payer: Self-pay | Admitting: Internal Medicine

## 2022-06-29 DIAGNOSIS — Z1231 Encounter for screening mammogram for malignant neoplasm of breast: Secondary | ICD-10-CM

## 2022-07-16 DIAGNOSIS — C44529 Squamous cell carcinoma of skin of other part of trunk: Secondary | ICD-10-CM | POA: Diagnosis not present

## 2022-08-30 ENCOUNTER — Ambulatory Visit (INDEPENDENT_AMBULATORY_CARE_PROVIDER_SITE_OTHER): Payer: Medicare Other | Admitting: Vascular Surgery

## 2022-08-30 ENCOUNTER — Encounter (INDEPENDENT_AMBULATORY_CARE_PROVIDER_SITE_OTHER): Payer: Medicare Other

## 2022-09-20 ENCOUNTER — Ambulatory Visit (INDEPENDENT_AMBULATORY_CARE_PROVIDER_SITE_OTHER): Payer: Medicare Other | Admitting: Vascular Surgery

## 2022-09-20 ENCOUNTER — Encounter (INDEPENDENT_AMBULATORY_CARE_PROVIDER_SITE_OTHER): Payer: Medicare Other

## 2022-09-20 ENCOUNTER — Encounter (INDEPENDENT_AMBULATORY_CARE_PROVIDER_SITE_OTHER): Payer: Self-pay

## 2022-09-27 ENCOUNTER — Encounter (INDEPENDENT_AMBULATORY_CARE_PROVIDER_SITE_OTHER): Payer: Self-pay

## 2022-09-29 DIAGNOSIS — F17210 Nicotine dependence, cigarettes, uncomplicated: Secondary | ICD-10-CM | POA: Diagnosis not present

## 2022-09-29 DIAGNOSIS — E7800 Pure hypercholesterolemia, unspecified: Secondary | ICD-10-CM | POA: Diagnosis not present

## 2022-09-29 DIAGNOSIS — I2510 Atherosclerotic heart disease of native coronary artery without angina pectoris: Secondary | ICD-10-CM | POA: Diagnosis not present

## 2022-09-29 DIAGNOSIS — I10 Essential (primary) hypertension: Secondary | ICD-10-CM | POA: Diagnosis not present

## 2022-09-29 DIAGNOSIS — Z79899 Other long term (current) drug therapy: Secondary | ICD-10-CM | POA: Diagnosis not present

## 2022-09-29 DIAGNOSIS — F411 Generalized anxiety disorder: Secondary | ICD-10-CM | POA: Diagnosis not present

## 2022-09-29 DIAGNOSIS — E559 Vitamin D deficiency, unspecified: Secondary | ICD-10-CM | POA: Diagnosis not present

## 2022-09-29 DIAGNOSIS — Z1231 Encounter for screening mammogram for malignant neoplasm of breast: Secondary | ICD-10-CM | POA: Diagnosis not present

## 2022-10-20 ENCOUNTER — Other Ambulatory Visit (INDEPENDENT_AMBULATORY_CARE_PROVIDER_SITE_OTHER): Payer: Self-pay | Admitting: Vascular Surgery

## 2022-10-20 DIAGNOSIS — I6523 Occlusion and stenosis of bilateral carotid arteries: Secondary | ICD-10-CM

## 2022-10-24 NOTE — Progress Notes (Deleted)
MRN : [id number]  Alyssa Watson is a 66 y.o. (03-24-1956) female who presents with chief complaint of check carotid arteries.  History of Present Illness:   The patient is seen for follow up evaluation of carotid stenosis.    She is status post left carotid endarterectomy with primary closure on August 03, 2013.  In April 2020 and a duplex ultrasound suggested greater than 70% recurrent stenosis however, CT angiography was performed and the lesion was noted to be 60%.     The patient denies amaurosis fugax. There is no recent history of TIA symptoms or focal motor deficits. There is no prior documented CVA.   The patient is taking enteric-coated aspirin 81 mg daily.   There is no history of migraine headaches. There is no history of seizures.   The patient has a history of coronary artery disease, no recent episodes of angina or shortness of breath. The patient denies PAD or claudication symptoms. There is a history of hyperlipidemia which is being treated with a statin.     Duplex ultrasound of the carotid arteries RICA 9-47% and LICA 09-62%  No outpatient medications have been marked as taking for the 10/25/22 encounter (Appointment) with Delana Meyer, Dolores Lory, MD.    Past Medical History:  Diagnosis Date   Anemia    Anxiety and depression    H/O carotid endarterectomy    H/O uterine prolapse 03/2014   Heart disease    h/o angioplasty   Hyperlipemia    IDA (iron deficiency anemia) 01/04/2022   Rash of face    Sleep apnea    Vaginal enterocele    1st degree cystocele    Past Surgical History:  Procedure Laterality Date   ANGIOPLASTY  05/2013   stent placed   CARPAL TUNNEL RELEASE Left    COLONOSCOPY WITH PROPOFOL N/A 01/22/2022   Procedure: COLONOSCOPY WITH PROPOFOL;  Surgeon: Lesly Rubenstein, MD;  Location: [facility] ENDOSCOPY;  Service: Endoscopy;  Laterality: N/A;   CORONARY ANGIOPLASTY WITH STENT PLACEMENT     ESOPHAGOGASTRODUODENOSCOPY (EGD) WITH PROPOFOL  N/A 01/22/2022   Procedure: ESOPHAGOGASTRODUODENOSCOPY (EGD) WITH PROPOFOL;  Surgeon: Lesly Rubenstein, MD;  Location: [facility] ENDOSCOPY;  Service: Endoscopy;  Laterality: N/A;   VAGINAL HYSTERECTOMY  03/2014   with BSO    Social History Social History   Tobacco Use   Smoking status: Every Day    Packs/day: 0.50    Years: 33.00    Total pack years: 16.50    Types: Cigarettes    Passive exposure: Never   Smokeless tobacco: Never  Vaping Use   Vaping Use: Never used  Substance Use Topics   Alcohol use: No    Alcohol/week: 0.0 standard drinks of alcohol   Drug use: No    Family History Family History  Problem Relation Age of Onset   Heart disease Mother    COPD Mother     Allergies  Allergen Reactions   Azelastine Other (See Comments)    Throat swelling   Fluticasone Other (See Comments)    Throat swelling   Codeine Rash   Penicillins Rash    Did it involve swelling of the face/tongue/throat, SOB, or low BP? No Did it involve sudden or severe rash/hives, skin peeling, or any reaction on the inside of your mouth or nose? No Did you need to seek medical attention at a hospital or doctor's office? No When did it last happen?      20 years ago If  all above answers are "NO", may proceed with cephalosporin use.     REVIEW OF SYSTEMS (Negative unless checked)  Constitutional: '[]'$ Weight loss  '[]'$ Fever  '[]'$ Chills Cardiac: '[]'$ Chest pain   '[]'$ Chest pressure   '[]'$ Palpitations   '[]'$ Shortness of breath when laying flat   '[]'$ Shortness of breath with exertion. Vascular:  '[x]'$ Pain in legs with walking   '[]'$ Pain in legs at rest  '[]'$ History of DVT   '[]'$ Phlebitis   '[]'$ Swelling in legs   '[]'$ Varicose veins   '[]'$ Non-healing ulcers Pulmonary:   '[]'$ Uses home oxygen   '[]'$ Productive cough   '[]'$ Hemoptysis   '[]'$ Wheeze  '[]'$ COPD   '[]'$ Asthma Neurologic:  '[]'$ Dizziness   '[]'$ Seizures   '[]'$ History of stroke   '[]'$ History of TIA  '[]'$ Aphasia   '[]'$ Vissual changes   '[]'$ Weakness or numbness in arm   '[]'$ Weakness or numbness in  leg Musculoskeletal:   '[]'$ Joint swelling   '[]'$ Joint pain   '[]'$ Low back pain Hematologic:  '[]'$ Easy bruising  '[]'$ Easy bleeding   '[]'$ Hypercoagulable state   '[]'$ Anemic Gastrointestinal:  '[]'$ Diarrhea   '[]'$ Vomiting  '[]'$ Gastroesophageal reflux/heartburn   '[]'$ Difficulty swallowing. Genitourinary:  '[]'$ Chronic kidney disease   '[]'$ Difficult urination  '[]'$ Frequent urination   '[]'$ Blood in urine Skin:  '[]'$ Rashes   '[]'$ Ulcers  Psychological:  '[]'$ History of anxiety   '[]'$  History of major depression.  Physical Examination  There were no vitals filed for this visit. There is no height or weight on file to calculate BMI. Gen: WD/WN, NAD Head: [place]/AT, No temporalis wasting.  Ear/Nose/Throat: Hearing grossly intact, nares w/o erythema or drainage Eyes: PER, EOMI, sclera nonicteric.  Neck: Supple, no masses.  No bruit or JVD.  Pulmonary:  Good air movement, no audible wheezing, no use of accessory muscles.  Cardiac: RRR, normal S1, S2, no Murmurs. Vascular:  carotid bruit noted Vessel Right Left  Radial Palpable Palpable  Carotid  Palpable  Palpable  Subclav  Palpable Palpable  Gastrointestinal: soft, non-distended. No guarding/no peritoneal signs.  Musculoskeletal: M/S 5/5 throughout.  No visible deformity.  Neurologic: CN 2-12 intact. Pain and light touch intact in extremities.  Symmetrical.  Speech is fluent. Motor exam as listed above. Psychiatric: Judgment intact, Mood & affect appropriate for pt's clinical situation. Dermatologic: No rashes or ulcers noted.  No changes consistent with cellulitis.   CBC Lab Results  Component Value Date   WBC 5.3 04/21/2022   HGB 13.2 04/21/2022   HCT 39.5 04/21/2022   MCV 91.6 04/21/2022   PLT 228 04/21/2022    BMET    Component Value Date/Time   NA 140 05/21/2020 0420   NA 139 08/04/2013 0325   K 3.7 05/21/2020 0420   K 3.9 08/04/2013 0325   CL 108 05/21/2020 0420   CL 107 08/04/2013 0325   CO2 26 05/21/2020 0420   CO2 27 08/04/2013 0325   GLUCOSE 96 05/21/2020 0420    GLUCOSE 92 08/04/2013 0325   BUN 9 05/21/2020 0420   BUN 8 08/04/2013 0325   CREATININE 0.70 05/21/2020 0420   CREATININE 0.64 08/04/2013 0325   CALCIUM 9.3 05/21/2020 0420   CALCIUM 8.8 08/04/2013 0325   GFRNONAA >60 05/21/2020 0420   GFRNONAA >60 08/04/2013 0325   GFRAA >60 05/21/2020 0420   GFRAA >60 08/04/2013 0325   CrCl cannot be calculated (Patient's most recent lab result is older than the maximum 21 days allowed.).  COAG Lab Results  Component Value Date   INR 0.9 08/04/2013    Radiology No results found.   Assessment/Plan There are no diagnoses linked to this  encounter.   Hortencia Pilar, MD  10/24/2022 4:40 PM

## 2022-10-25 ENCOUNTER — Encounter (INDEPENDENT_AMBULATORY_CARE_PROVIDER_SITE_OTHER): Payer: Medicare Other

## 2022-10-25 ENCOUNTER — Ambulatory Visit (INDEPENDENT_AMBULATORY_CARE_PROVIDER_SITE_OTHER): Payer: Medicare HMO | Admitting: Vascular Surgery

## 2022-10-25 DIAGNOSIS — I10 Essential (primary) hypertension: Secondary | ICD-10-CM

## 2022-10-25 DIAGNOSIS — I2510 Atherosclerotic heart disease of native coronary artery without angina pectoris: Secondary | ICD-10-CM

## 2022-10-25 DIAGNOSIS — E7800 Pure hypercholesterolemia, unspecified: Secondary | ICD-10-CM

## 2022-10-25 DIAGNOSIS — I6523 Occlusion and stenosis of bilateral carotid arteries: Secondary | ICD-10-CM

## 2022-12-08 DIAGNOSIS — H2513 Age-related nuclear cataract, bilateral: Secondary | ICD-10-CM | POA: Diagnosis not present

## 2022-12-08 DIAGNOSIS — H0288A Meibomian gland dysfunction right eye, upper and lower eyelids: Secondary | ICD-10-CM | POA: Diagnosis not present

## 2022-12-08 DIAGNOSIS — H0288B Meibomian gland dysfunction left eye, upper and lower eyelids: Secondary | ICD-10-CM | POA: Diagnosis not present

## 2022-12-08 DIAGNOSIS — H33192 Other retinoschisis and retinal cysts, left eye: Secondary | ICD-10-CM | POA: Diagnosis not present

## 2022-12-08 DIAGNOSIS — H1045 Other chronic allergic conjunctivitis: Secondary | ICD-10-CM | POA: Diagnosis not present

## 2022-12-08 DIAGNOSIS — Z0100 Encounter for examination of eyes and vision without abnormal findings: Secondary | ICD-10-CM | POA: Diagnosis not present

## 2022-12-31 DIAGNOSIS — E7800 Pure hypercholesterolemia, unspecified: Secondary | ICD-10-CM | POA: Diagnosis not present

## 2022-12-31 DIAGNOSIS — Z79899 Other long term (current) drug therapy: Secondary | ICD-10-CM | POA: Diagnosis not present

## 2022-12-31 DIAGNOSIS — I10 Essential (primary) hypertension: Secondary | ICD-10-CM | POA: Diagnosis not present

## 2022-12-31 DIAGNOSIS — R7309 Other abnormal glucose: Secondary | ICD-10-CM | POA: Diagnosis not present

## 2023-01-07 DIAGNOSIS — F419 Anxiety disorder, unspecified: Secondary | ICD-10-CM | POA: Diagnosis not present

## 2023-01-07 DIAGNOSIS — R7303 Prediabetes: Secondary | ICD-10-CM | POA: Diagnosis not present

## 2023-01-07 DIAGNOSIS — Z720 Tobacco use: Secondary | ICD-10-CM | POA: Diagnosis not present

## 2023-01-07 DIAGNOSIS — E785 Hyperlipidemia, unspecified: Secondary | ICD-10-CM | POA: Diagnosis not present

## 2023-01-07 DIAGNOSIS — Z0000 Encounter for general adult medical examination without abnormal findings: Secondary | ICD-10-CM | POA: Diagnosis not present

## 2023-01-07 DIAGNOSIS — I10 Essential (primary) hypertension: Secondary | ICD-10-CM | POA: Diagnosis not present

## 2023-01-07 DIAGNOSIS — Z1231 Encounter for screening mammogram for malignant neoplasm of breast: Secondary | ICD-10-CM | POA: Diagnosis not present

## 2023-01-10 ENCOUNTER — Other Ambulatory Visit: Payer: Self-pay | Admitting: Internal Medicine

## 2023-01-10 DIAGNOSIS — Z1231 Encounter for screening mammogram for malignant neoplasm of breast: Secondary | ICD-10-CM

## 2023-01-26 DIAGNOSIS — J0100 Acute maxillary sinusitis, unspecified: Secondary | ICD-10-CM | POA: Diagnosis not present

## 2023-01-26 DIAGNOSIS — K069 Disorder of gingiva and edentulous alveolar ridge, unspecified: Secondary | ICD-10-CM | POA: Diagnosis not present

## 2023-01-26 DIAGNOSIS — R519 Headache, unspecified: Secondary | ICD-10-CM | POA: Diagnosis not present

## 2023-03-03 DIAGNOSIS — I700 Atherosclerosis of aorta: Secondary | ICD-10-CM | POA: Diagnosis not present

## 2023-03-03 DIAGNOSIS — Q231 Congenital insufficiency of aortic valve: Secondary | ICD-10-CM | POA: Diagnosis not present

## 2023-03-03 DIAGNOSIS — G44229 Chronic tension-type headache, not intractable: Secondary | ICD-10-CM | POA: Diagnosis not present

## 2023-03-03 DIAGNOSIS — I10 Essential (primary) hypertension: Secondary | ICD-10-CM | POA: Diagnosis not present

## 2023-03-03 DIAGNOSIS — I2510 Atherosclerotic heart disease of native coronary artery without angina pectoris: Secondary | ICD-10-CM | POA: Diagnosis not present

## 2023-03-15 DIAGNOSIS — J019 Acute sinusitis, unspecified: Secondary | ICD-10-CM | POA: Diagnosis not present

## 2023-03-28 DIAGNOSIS — J069 Acute upper respiratory infection, unspecified: Secondary | ICD-10-CM | POA: Diagnosis not present

## 2023-04-08 DIAGNOSIS — Z720 Tobacco use: Secondary | ICD-10-CM | POA: Diagnosis not present

## 2023-04-08 DIAGNOSIS — F419 Anxiety disorder, unspecified: Secondary | ICD-10-CM | POA: Diagnosis not present

## 2023-04-08 DIAGNOSIS — I10 Essential (primary) hypertension: Secondary | ICD-10-CM | POA: Diagnosis not present

## 2023-04-08 DIAGNOSIS — R7303 Prediabetes: Secondary | ICD-10-CM | POA: Diagnosis not present

## 2023-04-08 DIAGNOSIS — Z79899 Other long term (current) drug therapy: Secondary | ICD-10-CM | POA: Diagnosis not present

## 2023-04-08 DIAGNOSIS — E785 Hyperlipidemia, unspecified: Secondary | ICD-10-CM | POA: Diagnosis not present

## 2023-04-08 DIAGNOSIS — Z1231 Encounter for screening mammogram for malignant neoplasm of breast: Secondary | ICD-10-CM | POA: Diagnosis not present

## 2023-04-27 DIAGNOSIS — R55 Syncope and collapse: Secondary | ICD-10-CM | POA: Diagnosis not present

## 2023-05-21 DIAGNOSIS — E119 Type 2 diabetes mellitus without complications: Secondary | ICD-10-CM | POA: Diagnosis not present

## 2023-05-21 DIAGNOSIS — H5203 Hypermetropia, bilateral: Secondary | ICD-10-CM | POA: Diagnosis not present

## 2023-05-21 DIAGNOSIS — H25813 Combined forms of age-related cataract, bilateral: Secondary | ICD-10-CM | POA: Diagnosis not present

## 2023-07-11 DIAGNOSIS — E7800 Pure hypercholesterolemia, unspecified: Secondary | ICD-10-CM | POA: Diagnosis not present

## 2023-07-11 DIAGNOSIS — R7303 Prediabetes: Secondary | ICD-10-CM | POA: Diagnosis not present

## 2023-07-11 DIAGNOSIS — I10 Essential (primary) hypertension: Secondary | ICD-10-CM | POA: Diagnosis not present

## 2023-07-11 DIAGNOSIS — Z79899 Other long term (current) drug therapy: Secondary | ICD-10-CM | POA: Diagnosis not present

## 2023-07-15 DIAGNOSIS — R7303 Prediabetes: Secondary | ICD-10-CM | POA: Diagnosis not present

## 2023-07-15 DIAGNOSIS — Z0000 Encounter for general adult medical examination without abnormal findings: Secondary | ICD-10-CM | POA: Diagnosis not present

## 2023-07-15 DIAGNOSIS — I10 Essential (primary) hypertension: Secondary | ICD-10-CM | POA: Diagnosis not present

## 2023-07-15 DIAGNOSIS — Z1231 Encounter for screening mammogram for malignant neoplasm of breast: Secondary | ICD-10-CM | POA: Diagnosis not present

## 2023-07-15 DIAGNOSIS — I2510 Atherosclerotic heart disease of native coronary artery without angina pectoris: Secondary | ICD-10-CM | POA: Diagnosis not present

## 2023-07-15 DIAGNOSIS — E785 Hyperlipidemia, unspecified: Secondary | ICD-10-CM | POA: Diagnosis not present

## 2023-07-15 DIAGNOSIS — Z720 Tobacco use: Secondary | ICD-10-CM | POA: Diagnosis not present

## 2023-09-01 DIAGNOSIS — Q2381 Bicuspid aortic valve: Secondary | ICD-10-CM | POA: Diagnosis not present

## 2023-09-01 DIAGNOSIS — Q2112 Patent foramen ovale: Secondary | ICD-10-CM | POA: Diagnosis not present

## 2023-09-01 DIAGNOSIS — Z720 Tobacco use: Secondary | ICD-10-CM | POA: Diagnosis not present

## 2023-09-01 DIAGNOSIS — I700 Atherosclerosis of aorta: Secondary | ICD-10-CM | POA: Diagnosis not present

## 2023-09-01 DIAGNOSIS — J431 Panlobular emphysema: Secondary | ICD-10-CM | POA: Diagnosis not present

## 2023-09-01 DIAGNOSIS — E7800 Pure hypercholesterolemia, unspecified: Secondary | ICD-10-CM | POA: Diagnosis not present

## 2023-09-01 DIAGNOSIS — G4733 Obstructive sleep apnea (adult) (pediatric): Secondary | ICD-10-CM | POA: Diagnosis not present

## 2023-09-01 DIAGNOSIS — I2510 Atherosclerotic heart disease of native coronary artery without angina pectoris: Secondary | ICD-10-CM | POA: Diagnosis not present

## 2023-11-04 DIAGNOSIS — R7303 Prediabetes: Secondary | ICD-10-CM | POA: Diagnosis not present

## 2023-11-04 DIAGNOSIS — I10 Essential (primary) hypertension: Secondary | ICD-10-CM | POA: Diagnosis not present

## 2023-11-04 DIAGNOSIS — Z79899 Other long term (current) drug therapy: Secondary | ICD-10-CM | POA: Diagnosis not present

## 2023-11-04 DIAGNOSIS — Z1231 Encounter for screening mammogram for malignant neoplasm of breast: Secondary | ICD-10-CM | POA: Diagnosis not present

## 2023-11-04 DIAGNOSIS — F411 Generalized anxiety disorder: Secondary | ICD-10-CM | POA: Diagnosis not present

## 2023-11-04 DIAGNOSIS — D649 Anemia, unspecified: Secondary | ICD-10-CM | POA: Diagnosis not present

## 2023-11-04 DIAGNOSIS — E7800 Pure hypercholesterolemia, unspecified: Secondary | ICD-10-CM | POA: Diagnosis not present

## 2023-11-04 DIAGNOSIS — E538 Deficiency of other specified B group vitamins: Secondary | ICD-10-CM | POA: Diagnosis not present

## 2023-11-07 ENCOUNTER — Other Ambulatory Visit: Payer: Self-pay | Admitting: Internal Medicine

## 2023-11-07 DIAGNOSIS — Z1231 Encounter for screening mammogram for malignant neoplasm of breast: Secondary | ICD-10-CM

## 2023-11-11 DIAGNOSIS — L570 Actinic keratosis: Secondary | ICD-10-CM | POA: Diagnosis not present

## 2023-11-11 DIAGNOSIS — L821 Other seborrheic keratosis: Secondary | ICD-10-CM | POA: Diagnosis not present

## 2023-11-17 DIAGNOSIS — R7989 Other specified abnormal findings of blood chemistry: Secondary | ICD-10-CM | POA: Diagnosis not present

## 2023-11-17 DIAGNOSIS — M778 Other enthesopathies, not elsewhere classified: Secondary | ICD-10-CM | POA: Diagnosis not present

## 2023-11-17 DIAGNOSIS — M7581 Other shoulder lesions, right shoulder: Secondary | ICD-10-CM | POA: Diagnosis not present

## 2023-12-15 DIAGNOSIS — E538 Deficiency of other specified B group vitamins: Secondary | ICD-10-CM | POA: Diagnosis not present

## 2023-12-15 DIAGNOSIS — I10 Essential (primary) hypertension: Secondary | ICD-10-CM | POA: Diagnosis not present

## 2023-12-15 DIAGNOSIS — F411 Generalized anxiety disorder: Secondary | ICD-10-CM | POA: Diagnosis not present

## 2023-12-15 DIAGNOSIS — M546 Pain in thoracic spine: Secondary | ICD-10-CM | POA: Diagnosis not present

## 2024-01-17 DIAGNOSIS — Z03818 Encounter for observation for suspected exposure to other biological agents ruled out: Secondary | ICD-10-CM | POA: Diagnosis not present

## 2024-01-17 DIAGNOSIS — J029 Acute pharyngitis, unspecified: Secondary | ICD-10-CM | POA: Diagnosis not present

## 2024-02-03 ENCOUNTER — Other Ambulatory Visit: Payer: Self-pay | Admitting: Internal Medicine

## 2024-02-03 DIAGNOSIS — Z1231 Encounter for screening mammogram for malignant neoplasm of breast: Secondary | ICD-10-CM

## 2024-02-03 DIAGNOSIS — I10 Essential (primary) hypertension: Secondary | ICD-10-CM | POA: Diagnosis not present

## 2024-02-03 DIAGNOSIS — E785 Hyperlipidemia, unspecified: Secondary | ICD-10-CM | POA: Diagnosis not present

## 2024-02-03 DIAGNOSIS — E559 Vitamin D deficiency, unspecified: Secondary | ICD-10-CM | POA: Diagnosis not present

## 2024-02-03 DIAGNOSIS — Z0000 Encounter for general adult medical examination without abnormal findings: Secondary | ICD-10-CM | POA: Diagnosis not present

## 2024-02-03 DIAGNOSIS — J449 Chronic obstructive pulmonary disease, unspecified: Secondary | ICD-10-CM | POA: Diagnosis not present

## 2024-02-03 DIAGNOSIS — R7303 Prediabetes: Secondary | ICD-10-CM | POA: Diagnosis not present

## 2024-02-03 DIAGNOSIS — J431 Panlobular emphysema: Secondary | ICD-10-CM | POA: Diagnosis not present

## 2024-02-03 DIAGNOSIS — F419 Anxiety disorder, unspecified: Secondary | ICD-10-CM | POA: Diagnosis not present

## 2024-02-03 DIAGNOSIS — Z79899 Other long term (current) drug therapy: Secondary | ICD-10-CM | POA: Diagnosis not present

## 2024-02-11 ENCOUNTER — Emergency Department
Admission: EM | Admit: 2024-02-11 | Discharge: 2024-02-11 | Disposition: A | Discharge status: Home / Self Care | Attending: Emergency Medicine | Admitting: Emergency Medicine

## 2024-02-11 ENCOUNTER — Other Ambulatory Visit: Payer: Self-pay

## 2024-02-11 DIAGNOSIS — T18108A Unspecified foreign body in esophagus causing other injury, initial encounter: Secondary | ICD-10-CM | POA: Insufficient documentation

## 2024-02-11 DIAGNOSIS — T17920A Food in respiratory tract, part unspecified causing asphyxiation, initial encounter: Secondary | ICD-10-CM | POA: Diagnosis not present

## 2024-02-11 DIAGNOSIS — R0600 Dyspnea, unspecified: Secondary | ICD-10-CM | POA: Diagnosis not present

## 2024-02-11 DIAGNOSIS — R0689 Other abnormalities of breathing: Secondary | ICD-10-CM | POA: Diagnosis not present

## 2024-02-11 DIAGNOSIS — I10 Essential (primary) hypertension: Secondary | ICD-10-CM | POA: Diagnosis not present

## 2024-02-11 DIAGNOSIS — W448XXA Other foreign body entering into or through a natural orifice, initial encounter: Secondary | ICD-10-CM | POA: Diagnosis not present

## 2024-02-11 NOTE — ED Triage Notes (Addendum)
 Pt arrives via [facility] with c/o a pill caught in their throat and was choking on it about hour ago. Pt's family tried to do the heimlich maneuver on the pt because they weren't able to breath. Pt states that they were able to breathe easier after the maneuver was performed. Pt states that they feel like the pill is still stuck in their throat. Pt is c/o of pain on their left flank, thinks that one of their ribs might have been cracked this morning. Pt is speaking in full sentences with no issues. Pt is A&Ox4.

## 2024-02-11 NOTE — ED Provider Notes (Signed)
 [facility] Provider Note    Event Date/Time   First MD Initiated Contact with Patient 02/11/24 1003     (approximate)   History   pill stuck in throat   HPI  Alyssa Watson is a 68 y.o. female who presents after a choking episode.  Patient reports she was taking her pill and felt to get lodged in the back of her throat.  She never had difficulty breathing but she reports her family did the Heimlich maneuver on her.  Now she is feeling better and thinks the pill has gone down.  She has mild soreness to her ribs     Physical Exam   Triage Vital Signs: ED Triage Vitals  Encounter Vitals Group     BP 02/11/24 0959 125/62     Systolic BP Percentile --      Diastolic BP Percentile --      Pulse Rate 02/11/24 0959 61     Resp 02/11/24 0959 15     Temp 02/11/24 0959 98.2 F (36.8 C)     Temp Source 02/11/24 0959 Oral     SpO2 02/11/24 0958 98 %     Weight 02/11/24 1003 53.1 kg (117 lb)     Height 02/11/24 1003 1.562 m (5' 1.5")     Head Circumference --      Peak Flow --      Pain Score 02/11/24 1002 5     Pain Loc --      Pain Education --      Exclude from Growth Chart --     Most recent vital signs: Vitals:   02/11/24 1100 02/11/24 1130  BP: 125/64 (!) 104/58  Pulse: (!) 59 (!) 57  Resp:  16  Temp:  98.3 F (36.8 C)  SpO2:  94%     General: Awake, no distress.  CV:  Good peripheral perfusion.  Minimal tenderness to the left lower ribs, no bruising, no bony normalities Resp:  Normal effort.  Abd:  No distention.  Other:  Pharyngeal exam is normal   ED Results / Procedures / Treatments   Labs (all labs ordered are listed, but only abnormal results are displayed) Labs Reviewed - No data to display   EKG     RADIOLOGY     PROCEDURES:  Critical Care performed:   Procedures   MEDICATIONS ORDERED IN ED: Medications - No data to display   IMPRESSION / MDM / ASSESSMENT AND PLAN / ED COURSE  I reviewed the triage  vital signs and the nursing notes. Patient's presentation is most consistent with acute presentation with potential threat to life or bodily function.  Patient presents as above, differential includes esophageal foreign body, choking  Patient well-appearing here in no acute distress, she feels that she can tolerate liquids so I had her drink soda which she was able to do without difficulty.  She reports she has had a mild sore throat over the last several weeks, has had antibiotics for this.  Given that she is tolerating p.o.'s, no concern for esophageal impaction appropriate for discharge at this time with outpatient follow-up with ENT        FINAL CLINICAL IMPRESSION(S) / ED DIAGNOSES   Final diagnoses:  Esophageal foreign body, initial encounter     Rx / DC Orders   ED Discharge Orders     None        Note:  This document was prepared using Dragon voice recognition software  and may include unintentional dictation errors.   Jene Every, MD 02/11/24 1210

## 2024-02-14 DIAGNOSIS — I10 Essential (primary) hypertension: Secondary | ICD-10-CM | POA: Diagnosis not present

## 2024-02-14 DIAGNOSIS — R1310 Dysphagia, unspecified: Secondary | ICD-10-CM | POA: Diagnosis not present

## 2024-02-14 DIAGNOSIS — S299XXA Unspecified injury of thorax, initial encounter: Secondary | ICD-10-CM | POA: Diagnosis not present

## 2024-02-14 DIAGNOSIS — R7989 Other specified abnormal findings of blood chemistry: Secondary | ICD-10-CM | POA: Diagnosis not present

## 2024-03-20 DIAGNOSIS — G4733 Obstructive sleep apnea (adult) (pediatric): Secondary | ICD-10-CM | POA: Diagnosis not present

## 2024-03-20 DIAGNOSIS — R55 Syncope and collapse: Secondary | ICD-10-CM | POA: Diagnosis not present

## 2024-03-20 DIAGNOSIS — E7800 Pure hypercholesterolemia, unspecified: Secondary | ICD-10-CM | POA: Diagnosis not present

## 2024-03-20 DIAGNOSIS — Z720 Tobacco use: Secondary | ICD-10-CM | POA: Diagnosis not present

## 2024-03-20 DIAGNOSIS — Q2381 Bicuspid aortic valve: Secondary | ICD-10-CM | POA: Diagnosis not present

## 2024-03-20 DIAGNOSIS — I2510 Atherosclerotic heart disease of native coronary artery without angina pectoris: Secondary | ICD-10-CM | POA: Diagnosis not present

## 2024-05-11 DIAGNOSIS — I10 Essential (primary) hypertension: Secondary | ICD-10-CM | POA: Diagnosis not present

## 2024-05-11 DIAGNOSIS — E7800 Pure hypercholesterolemia, unspecified: Secondary | ICD-10-CM | POA: Diagnosis not present

## 2024-05-11 DIAGNOSIS — F411 Generalized anxiety disorder: Secondary | ICD-10-CM | POA: Diagnosis not present

## 2024-05-11 DIAGNOSIS — E538 Deficiency of other specified B group vitamins: Secondary | ICD-10-CM | POA: Diagnosis not present

## 2024-05-11 DIAGNOSIS — J431 Panlobular emphysema: Secondary | ICD-10-CM | POA: Diagnosis not present

## 2024-05-11 DIAGNOSIS — Z79899 Other long term (current) drug therapy: Secondary | ICD-10-CM | POA: Diagnosis not present

## 2024-05-11 DIAGNOSIS — R7303 Prediabetes: Secondary | ICD-10-CM | POA: Diagnosis not present

## 2024-05-15 ENCOUNTER — Emergency Department
Admission: EM | Admit: 2024-05-15 | Discharge: 2024-05-16 | Disposition: A | Discharge status: Home / Self Care | Attending: Emergency Medicine | Admitting: Emergency Medicine

## 2024-05-15 ENCOUNTER — Other Ambulatory Visit: Payer: Self-pay

## 2024-05-15 ENCOUNTER — Emergency Department

## 2024-05-15 DIAGNOSIS — D649 Anemia, unspecified: Secondary | ICD-10-CM | POA: Insufficient documentation

## 2024-05-15 DIAGNOSIS — R7989 Other specified abnormal findings of blood chemistry: Secondary | ICD-10-CM | POA: Diagnosis not present

## 2024-05-15 DIAGNOSIS — R918 Other nonspecific abnormal finding of lung field: Secondary | ICD-10-CM | POA: Diagnosis not present

## 2024-05-15 DIAGNOSIS — R0602 Shortness of breath: Secondary | ICD-10-CM | POA: Diagnosis not present

## 2024-05-15 DIAGNOSIS — R531 Weakness: Secondary | ICD-10-CM | POA: Diagnosis not present

## 2024-05-15 LAB — BASIC METABOLIC PANEL WITH GFR
Anion gap: 8 (ref 5–15)
BUN: 10 mg/dL (ref 8–23)
CO2: 22 mmol/L (ref 22–32)
Calcium: 9.1 mg/dL (ref 8.9–10.3)
Chloride: 107 mmol/L (ref 98–111)
Creatinine, Ser: 0.76 mg/dL (ref 0.44–1.00)
GFR, Estimated: >60 mL/min (ref >60)
Glucose, Bld: 93 mg/dL (ref 70–99)
Potassium: 3.6 mmol/L (ref 3.5–5.1)
Sodium: 137 mmol/L (ref 135–145)

## 2024-05-15 LAB — CBC
HCT: 24.6 % — ABNORMAL LOW (ref 36.0–46.0)
Hemoglobin: 6.7 g/dL — ABNORMAL LOW (ref 12.0–15.0)
MCH: 18.8 pg — ABNORMAL LOW (ref 26.0–34.0)
MCHC: 27.2 g/dL — ABNORMAL LOW (ref 30.0–36.0)
MCV: 69.1 fL — ABNORMAL LOW (ref 80.0–100.0)
Platelets: 233 10*3/uL (ref 150–400)
RBC: 3.56 MIL/uL — ABNORMAL LOW (ref 3.87–5.11)
RDW: 18 % — ABNORMAL HIGH (ref 11.5–15.5)
WBC: 4 10*3/uL (ref 4.0–10.5)
nRBC: 0 % (ref 0.0–0.2)

## 2024-05-15 LAB — PREPARE RBC (CROSSMATCH)

## 2024-05-15 LAB — IRON AND TIBC
Iron: 18 ug/dL — ABNORMAL LOW (ref 28–170)
Saturation Ratios: 3 % — ABNORMAL LOW (ref 10.4–31.8)
TIBC: 528 ug/dL — ABNORMAL HIGH (ref 250–450)
UIBC: 510 ug/dL

## 2024-05-15 LAB — FERRITIN: Ferritin: 6 ng/mL — ABNORMAL LOW (ref 11–307)

## 2024-05-15 LAB — ABO/RH: ABO/RH(D): O POS

## 2024-05-15 MED ORDER — SODIUM CHLORIDE 0.9 % IV SOLN
10.0000 mL/h | Freq: Once | INTRAVENOUS | Status: AC
  Administered 2024-05-16: 10 mL/h via INTRAVENOUS

## 2024-05-15 NOTE — ED Provider Notes (Signed)
 [facility] Provider Note    Event Date/Time   First MD Initiated Contact with Patient 05/15/24 1919     (approximate)   History   Low blood count  HPI  Alyssa Watson is a 68 y.o. female who advises she was told to come to the ER because of a low blood count.  Patient relates a history of used to be on iron  supplementation at 1 point was receiving IV iron  infusions.  She received a phone call today that she should come to the ER because her blood count is low.  She denies any symptoms except she has noticed that for the last several months she will feel low on energy and when she walks or exerts herself she will get short of short of breath or tired easily.  She has no chest pain no abdominal pain she has had no vomiting no black or bloody stools and reports she has had no bleeding.  She takes no anticoagulant   Patient reports she feels perfectly fine right now but has been to the doctor twice and had her blood checked and they notified her to come today as her blood count continued to be low  She has never had a blood transfusion.  Patient does relate that she has had iron  infusions in the past and sees Dr. Claudius Cumins     Physical Exam   Triage Vital Signs: ED Triage Vitals [05/15/24 1749]  Encounter Vitals Group     BP 137/71     Girls Systolic BP Percentile      Girls Diastolic BP Percentile      Boys Systolic BP Percentile      Boys Diastolic BP Percentile      Pulse Rate 69     Resp 20     Temp 98.1 F (36.7 C)     Temp Source Oral     SpO2 98 %     Weight 115 lb (52.2 kg)     Height 5' 1.5 (1.562 m)     Head Circumference      Peak Flow      Pain Score 0     Pain Loc      Pain Education      Exclude from Growth Chart     Most recent vital signs: Vitals:   05/16/24 0320 05/16/24 0324  BP: (!) 144/61   Pulse: 68   Resp: (!) 21   Temp: 98 F (36.7 C) 98 F (36.7 C)  SpO2: 98%      General: Awake, no distress.  CV:  Good  peripheral perfusion.  Normal tones and rateResp:  Normal effort.  Clear bilateral Abd:  No distention.  Soft nontender Other:  Calm pleasant in no distress.   ED Results / Procedures / Treatments   Labs (all labs ordered are listed, but only abnormal results are displayed) Labs Reviewed  CBC - Abnormal; Notable for the following components:      Result Value   RBC 3.56 (*)    Hemoglobin 6.7 (*)    HCT 24.6 (*)    MCV 69.1 (*)    MCH 18.8 (*)    MCHC 27.2 (*)    RDW 18.0 (*)    All other components within normal limits  IRON  AND TIBC - Abnormal; Notable for the following components:   Iron  18 (*)    TIBC 528 (*)    Saturation Ratios 3 (*)    All other  components within normal limits  FERRITIN - Abnormal; Notable for the following components:   Ferritin 6 (*)    All other components within normal limits  BASIC METABOLIC PANEL WITH GFR  TYPE AND SCREEN  PREPARE RBC (CROSSMATCH)  ABO/RH   Labs reviewed based on her iron  profile I suspect this is most likely consistent with iron  deficiency.  Additionally it is noted the patient reports a history of previous IV iron  infusions  Lab reviewed patient has what appears to be a chronic degree of anemia, but also now notable anemia with hemoglobin 6.7 and just slightly higher a few days ago when checked in the clinic.  Her platelet count is appropriate white cells are appropriate.  EKG  Interpreted by me at 1755 heart rate 70 QRS 90 QTc 440 Normal sinus rhythm no evidence of acute ischemia   RADIOLOGY  DG Chest Port 1 View Result Date: 05/15/2024 EXAM: 1 VIEW XRAY OF THE CHEST 05/15/2024 06:54:00 PM COMPARISON: 05/21/2020 CLINICAL HISTORY: [id number] Weakness 141835. Pt reports was sent over due to abnormal labs. HGB 6.6. Pt reports intermittent SOB and light headed. FINDINGS: LUNGS AND PLEURA: Mild patchy bilateral lower lobe opacities, atelectasis versus pneumonia. Possible small left pleural effusion. HEART AND MEDIASTINUM: No acute  abnormality of the cardiac and mediastinal silhouettes. BONES AND SOFT TISSUES: No acute osseous abnormality. IMPRESSION: 1. Mild patchy bilateral lower lobe opacities, atelectasis or pneumonia. 2. Possible small left pleural effusion. Electronically signed by: Zadie Herter MD 05/15/2024 07:07 PM EDT RP Workstation: [id number]     Chest x-ray reviewed by me I suspect most likely atelectasis in this clinical picture.  No fever no white count no pulmonary symptoms.  X-ray ordered as part of a triage standing order, and clinically she has no symptomatology that point towards an acute pulmonary abnormality or infectious source.   PROCEDURES:  Critical Care performed: No  Procedures   MEDICATIONS ORDERED IN ED: Medications  0.9 %  sodium chloride  infusion (0 mL/hr Intravenous Stopped 05/16/24 0104)     IMPRESSION / MDM / ASSESSMENT AND PLAN / ED COURSE  I reviewed the triage vital signs and the nursing notes.                              Differential diagnosis includes, but is not limited to, iron  deficiency, dehydration, electrolyte abnormality etc.  She has no acute chest pain.  She is resting comfortably pleasant asymptomatic at rest and reports months of symptoms of exertional dyspnea but no chest pains.  She is very pleasant and has a history of previous iron  infusions.  Review of her labs today indicates that this is most likely iron  deficiency denies any black bloody stools bleeding, etc.  Risks benefits alternatives to blood transfusion discussed with patient agreeable to proceed.  Will transfuse 2 units.  I placed referral to oncology and hematology for follow-up and also strongly recommend she follow-up closely with Dr. Claudius Cumins which she is agreeable to do.  Patient's presentation is most consistent with acute complicated illness / injury requiring diagnostic workup.   Ongoing care assigned to Dr. Author Board.  Follow-up/reevaluate with likely discharge after completion of blood  transfusion       FINAL CLINICAL IMPRESSION(S) / ED DIAGNOSES   Final diagnoses:  Anemia, unspecified type     Rx / DC Orders   ED Discharge Orders          Ordered    Ambulatory referral  to Hematology / Oncology        05/15/24 2240             Note:  This document was prepared using Dragon voice recognition software and may include unintentional dictation errors.   Iver Marker, MD 05/16/24 [phone]

## 2024-05-15 NOTE — ED Triage Notes (Signed)
 Pt to ED via POV from [facility]. Pt reports was sent over due to abnormal labs. HGB 6.6. Pt reports intermittent SOB and light headed.

## 2024-05-16 NOTE — ED Notes (Signed)
 This RN came on shift when Pt pump blood transfusion was complete. Will document vitals for prev RN.

## 2024-05-17 LAB — BPAM RBC
Blood Product Expiration Date: 202507152359
Blood Product Expiration Date: 202507172359
ISSUE DATE / TIME: 202506172225
ISSUE DATE / TIME: 202506180139
Unit Type and Rh: 5100
Unit Type and Rh: 5100

## 2024-05-17 LAB — TYPE AND SCREEN
ABO/RH(D): O POS
Antibody Screen: NEGATIVE
Unit division: 0
Unit division: 0

## 2024-05-24 ENCOUNTER — Encounter: Payer: Self-pay | Admitting: Oncology

## 2024-05-24 ENCOUNTER — Inpatient Hospital Stay

## 2024-05-24 ENCOUNTER — Inpatient Hospital Stay: Attending: Oncology | Admitting: Oncology

## 2024-05-24 VITALS — BP 151/62 | HR 63 | Temp 96.1°F | Resp 19

## 2024-05-24 VITALS — BP 160/72 | HR 64 | Temp 96.9°F | Resp 18 | Wt 116.7 lb

## 2024-05-24 DIAGNOSIS — Z825 Family history of asthma and other chronic lower respiratory diseases: Secondary | ICD-10-CM | POA: Insufficient documentation

## 2024-05-24 DIAGNOSIS — Z7902 Long term (current) use of antithrombotics/antiplatelets: Secondary | ICD-10-CM | POA: Diagnosis not present

## 2024-05-24 DIAGNOSIS — Z880 Allergy status to penicillin: Secondary | ICD-10-CM | POA: Diagnosis not present

## 2024-05-24 DIAGNOSIS — Z90710 Acquired absence of both cervix and uterus: Secondary | ICD-10-CM | POA: Diagnosis not present

## 2024-05-24 DIAGNOSIS — K449 Diaphragmatic hernia without obstruction or gangrene: Secondary | ICD-10-CM | POA: Diagnosis not present

## 2024-05-24 DIAGNOSIS — K5730 Diverticulosis of large intestine without perforation or abscess without bleeding: Secondary | ICD-10-CM | POA: Diagnosis not present

## 2024-05-24 DIAGNOSIS — F17210 Nicotine dependence, cigarettes, uncomplicated: Secondary | ICD-10-CM | POA: Insufficient documentation

## 2024-05-24 DIAGNOSIS — Z885 Allergy status to narcotic agent status: Secondary | ICD-10-CM | POA: Diagnosis not present

## 2024-05-24 DIAGNOSIS — Z90722 Acquired absence of ovaries, bilateral: Secondary | ICD-10-CM | POA: Insufficient documentation

## 2024-05-24 DIAGNOSIS — Z7982 Long term (current) use of aspirin: Secondary | ICD-10-CM | POA: Diagnosis not present

## 2024-05-24 DIAGNOSIS — D509 Iron deficiency anemia, unspecified: Secondary | ICD-10-CM

## 2024-05-24 DIAGNOSIS — Z79899 Other long term (current) drug therapy: Secondary | ICD-10-CM | POA: Insufficient documentation

## 2024-05-24 DIAGNOSIS — Z8249 Family history of ischemic heart disease and other diseases of the circulatory system: Secondary | ICD-10-CM | POA: Diagnosis not present

## 2024-05-24 DIAGNOSIS — R5383 Other fatigue: Secondary | ICD-10-CM | POA: Insufficient documentation

## 2024-05-24 MED ORDER — IRON SUCROSE 20 MG/ML IV SOLN
200.0000 mg | Freq: Once | INTRAVENOUS | Status: AC
  Administered 2024-05-24: 200 mg via INTRAVENOUS

## 2024-05-24 MED ORDER — SODIUM CHLORIDE 0.9% FLUSH
10.0000 mL | Freq: Once | INTRAVENOUS | Status: AC | PRN
  Administered 2024-05-24: 10 mL
  Filled 2024-05-24: qty 10

## 2024-05-24 NOTE — Progress Notes (Signed)
 Hematology/Oncology Progress note [phone] [phone]            Patient Care Team: Auston Reyes BIRCH, MD as PCP - General (Internal Medicine) Babara Call, MD as Consulting Physician (Hematology)  REFERRING PROVIDER: Auston Reyes BIRCH, MD  CHIEF COMPLAINTS/REASON FOR VISIT:  Follow up for iron  deficiency anemia.    ASSESSMENT & PLAN:   IDA (iron  deficiency anemia) Lab Results  Component Value Date   HGB 6.7 (L) 05/15/2024   TIBC 528 (H) 05/15/2024   IRONPCTSAT 3 (L) 05/15/2024   FERRITIN 6 (L) 05/15/2024    Recommend IV venofer  weekly x 4 Refer to GI for evaluation.    Orders Placed This Encounter  Procedures   CBC with Differential (Cancer Center Only)    Standing Status:   Future    Expected Date:   08/24/2024    Expiration Date:   11/22/2024   Iron  and TIBC    Standing Status:   Future    Expected Date:   08/24/2024    Expiration Date:   11/22/2024   Ferritin    Standing Status:   Future    Expected Date:   08/24/2024    Expiration Date:   11/22/2024   Celiac panel 10    Standing Status:   Future    Expected Date:   08/24/2024    Expiration Date:   11/22/2024   Ambulatory referral to Gastroenterology    Referral Priority:   Routine    Referral Type:   Consultation    Referral Reason:   Specialty Services Required    Number of Visits Requested:   1    All questions were answered. The patient knows to call the clinic with any problems, questions or concerns.  Call Babara, MD, PhD [facility] Hematology Oncology 05/24/2024   HISTORY OF PRESENTING ILLNESS:   Alyssa Watson is a  68 y.o.  female with PMH listed below was seen in consultation at the request of  Auston Reyes BIRCH, MD  for evaluation of anemia  08/07/2022, CBC showed a hemoglobin of 11.1.  MCV 87.9. 12/10/2020, hemoglobin was decreased to 8.6, MCV 81.7, 12/25/2020, repeat CBC showed a hemoglobin of 8.7, MCV 79.5, iron  saturation 4, ferritin 10.  Patient was recommended to  take oral iron  supplementation 3 times a day.  She has started for about a week overall tolerates well.  She noticed that her stool has turned black.  She did Denies any vaginal bleeding, black tarry stools or blood in the stool.  She reports feeling tired.  No pica symptoms. Patient has never had a colonoscopy.  Patient denies any family history of colon cancer. Patient reports that she is a picky eater.  Diet is not very good.  She has lost 2 to 3 pounds since April 2022.  She denies any abdominal pain, nausea vomiting diarrhea.  01/22/22 EGD showed small hiatal hernia, normal examination.  Stomach biopsy showed reactive gastropathy with focal interstitial metaplasia.  Duodenum biopsy negative. Colonoscopy Diverticulosis in the sigmoid colon, in the descending colon, in the transverse colon and in the ascending colon. - Two 2 to 3 mm polyps in the transverse colon, removed with a cold snare. Resected and retrieved.-Pathology showed tubular adenoma and sessile serrated polyp without dysplasia.  INTERVAL HISTORY Alyssa Watson is a 67 y.o. female who has above history reviewed by me today presents for follow up visit for IDA She was last seen in 2023 for iron  deficiency anemia.  Recent blood work showed Hb 6.7, severe iron  deficiency.  She has received 2 units of PRBC since then.  Presents to re-establish care  She denies hematochezia, hematuria, hematemesis, epistaxis, black tarry stool or easy bruising. Denies abdominal pain.  Previously she tolerated IV venfer well.  + fatigue and tired.    Review of Systems  Constitutional:  Positive for fatigue. Negative for appetite change, chills and fever.  HENT:   Negative for hearing loss and voice change.   Eyes:  Negative for eye problems.  Respiratory:  Negative for chest tightness and cough.   Cardiovascular:  Negative for chest pain.  Gastrointestinal:  Negative for abdominal distention, abdominal pain and blood in stool.  Endocrine:  Negative for hot flashes.  Genitourinary:  Negative for difficulty urinating and frequency.   Musculoskeletal:  Negative for arthralgias.  Skin:  Negative for itching and rash.  Neurological:  Negative for extremity weakness.  Hematological:  Negative for adenopathy.  Psychiatric/Behavioral:  Negative for confusion.     MEDICAL HISTORY:  Past Medical History:  Diagnosis Date   Anemia    Anxiety and depression    H/O carotid endarterectomy    H/O uterine prolapse 03/2014   Heart disease    h/o angioplasty   Hyperlipemia    IDA (iron  deficiency anemia) 01/04/2022   Rash of face    Sleep apnea    Vaginal enterocele    1st degree cystocele    SURGICAL HISTORY: Past Surgical History:  Procedure Laterality Date   ANGIOPLASTY  05/2013   stent placed   CARPAL TUNNEL RELEASE Left    COLONOSCOPY WITH PROPOFOL  N/A 01/22/2022   Procedure: COLONOSCOPY WITH PROPOFOL ;  Surgeon: Maryruth Ole DASEN, MD;  Location: [facility] ENDOSCOPY;  Service: Endoscopy;  Laterality: N/A;   CORONARY ANGIOPLASTY WITH STENT PLACEMENT     ESOPHAGOGASTRODUODENOSCOPY (EGD) WITH PROPOFOL  N/A 01/22/2022   Procedure: ESOPHAGOGASTRODUODENOSCOPY (EGD) WITH PROPOFOL ;  Surgeon: Maryruth Ole DASEN, MD;  Location: [facility] ENDOSCOPY;  Service: Endoscopy;  Laterality: N/A;   VAGINAL HYSTERECTOMY  03/2014   with BSO    SOCIAL HISTORY: Social History   Socioeconomic History   Marital status: Married    Spouse name: Not on file   Number of children: Not on file   Years of education: Not on file   Highest education level: Not on file  Occupational History   Not on file  Tobacco Use   Smoking status: Every Day    Current packs/day: 0.50    Average packs/day: 0.5 packs/day for 33.0 years (16.5 ttl pk-yrs)    Types: Cigarettes    Passive exposure: Never   Smokeless tobacco: Never  Vaping Use   Vaping status: Never Used  Substance and Sexual Activity   Alcohol use: No    Alcohol/week: 0.0 standard drinks of alcohol    Drug use: No   Sexual activity: Yes    Birth control/protection: None  Other Topics Concern   Not on file  Social History Narrative   Not on file   Social Drivers of Health   Financial Resource Strain: Low Risk  (11/04/2023)   Received from [facility]   Overall Financial Resource Strain (CARDIA)    Difficulty of Paying Living Expenses: Not hard at all  Food Insecurity: No Food Insecurity (11/04/2023)   Received from [facility]   Hunger Vital Sign    Within the past 12 months, the food you bought just didn't last and you didn't have money to get  more.: Never true    Within the past 12 months, you worried that your food would run out before you got the money to buy more.: Never true  Transportation Needs: No Transportation Needs (11/04/2023)   Received from [facility]   PRAPARE - Transportation    Lack of Transportation (Non-Medical): No    In the past 12 months, has lack of transportation kept you from medical appointments or from getting medications?: No  Physical Activity: Not on file  Stress: Not on file  Social Connections: Not on file  Intimate Partner Violence: Not on file    FAMILY HISTORY: Family History  Problem Relation Age of Onset   Heart disease Mother    COPD Mother     ALLERGIES:  is allergic to azelastine, fluticasone, codeine, and penicillins.  MEDICATIONS:  Current Outpatient Medications  Medication Sig Dispense Refill   ALPRAZolam  (XANAX ) 1 MG tablet      amLODipine (NORVASC) 5 MG tablet Take 5 mg by mouth daily.     atorvastatin (LIPITOR) 80 MG tablet Take 80 mg by mouth daily.     cetirizine (ZYRTEC) 10 MG tablet Take 10 mg by mouth daily.     clopidogrel  (PLAVIX ) 75 MG tablet Take 75 mg by mouth daily.     ezetimibe (ZETIA) 10 MG tablet Take 1 tablet by mouth daily.     gabapentin (NEURONTIN) 100 MG capsule Take 100 mg by mouth daily.     pantoprazole (PROTONIX) 40 MG tablet Take by mouth.      propranolol (INDERAL) 20 MG tablet Take 1 tablet by mouth 2 (two) times daily.     sertraline  (ZOLOFT ) 100 MG tablet Take by mouth.     SUMAtriptan (IMITREX) 100 MG tablet Take by mouth.     Vitamin D, Ergocalciferol, (DRISDOL) 1.25 MG (50000 UT) CAPS capsule Take 50,000 Units by mouth once a week.     aspirin  EC 81 MG tablet Take 81 mg by mouth daily. (Patient not taking: Reported on 05/24/2024)     azithromycin (ZITHROMAX) 250 MG tablet  (Patient not taking: Reported on 05/24/2024)     doxycycline (VIBRA-TABS) 100 MG tablet  (Patient not taking: Reported on 05/24/2024)     olopatadine (PATANOL) 0.1 % ophthalmic solution INSTILL 1 DROP INTO EACH AFFECTED EYE TWICE DAILY (Patient not taking: Reported on 05/24/2024)     rosuvastatin  (CRESTOR ) 40 MG tablet Take 40 mg by mouth daily. (Patient not taking: Reported on 05/24/2024)     No current facility-administered medications for this visit.     PHYSICAL EXAMINATION: ECOG PERFORMANCE STATUS: 1 - Symptomatic but completely ambulatory Vitals:   05/24/24 1056 05/24/24 1104  BP: (!) 162/69 (!) 160/72  Pulse: 64   Resp: 18   Temp: (!) 96.9 F (36.1 C)   SpO2: 100%    Filed Weights   05/24/24 1056  Weight: 116 lb 11.2 oz (52.9 kg)    Physical Exam Constitutional:      General: She is not in acute distress.    Comments: Patient ambulates independently  HENT:     Head: Normocephalic and atraumatic.   Eyes:     General: No scleral icterus.   Cardiovascular:     Rate and Rhythm: Normal rate.     Heart sounds: Normal heart sounds.  Pulmonary:     Effort: Pulmonary effort is normal. No respiratory distress.     Breath sounds: No wheezing.  Abdominal:     General: There is no distension.  Musculoskeletal:        General: No deformity. Normal range of motion.     Cervical back: Normal range of motion and neck supple.   Skin:    General: Skin is warm and dry.     Coloration: Skin is not pale.     Findings: No rash.    Neurological:     Mental Status: She is alert and oriented to person, place, and time. Mental status is at baseline.     Cranial Nerves: No cranial nerve deficit.     Coordination: Coordination normal.   Psychiatric:        Mood and Affect: Mood normal.     LABORATORY DATA:  I have reviewed the data as listed Lab Results  Component Value Date   WBC 4.0 05/15/2024   HGB 6.7 (L) 05/15/2024   HCT 24.6 (L) 05/15/2024   MCV 69.1 (L) 05/15/2024   PLT 233 05/15/2024   Recent Labs    05/15/24 1751  NA 137  K 3.6  CL 107  CO2 22  GLUCOSE 93  BUN 10  CREATININE 0.76  CALCIUM  9.1  GFRNONAA >60   Iron /TIBC/Ferritin/ %Sat    Component Value Date/Time   IRON  18 (L) 05/15/2024 1751   TIBC 528 (H) 05/15/2024 1751   FERRITIN 6 (L) 05/15/2024 1751   IRONPCTSAT 3 (L) 05/15/2024 1751      RADIOGRAPHIC STUDIES: I have personally reviewed the radiological images as listed and agreed with the findings in the report. DG Chest Port 1 View Result Date: 05/15/2024 EXAM: 1 VIEW XRAY OF THE CHEST 05/15/2024 06:54:00 PM COMPARISON: 05/21/2020 CLINICAL HISTORY: 141835 Weakness 141835. Pt reports was sent over due to abnormal labs. HGB 6.6. Pt reports intermittent SOB and light headed. FINDINGS: LUNGS AND PLEURA: Mild patchy bilateral lower lobe opacities, atelectasis versus pneumonia. Possible small left pleural effusion. HEART AND MEDIASTINUM: No acute abnormality of the cardiac and mediastinal silhouettes. BONES AND SOFT TISSUES: No acute osseous abnormality. IMPRESSION: 1. Mild patchy bilateral lower lobe opacities, atelectasis or pneumonia. 2. Possible small left pleural effusion. Electronically signed by: Pinkie Pebbles MD 05/15/2024 07:07 PM EDT RP Workstation: [id number]      ASSESSMENT & PLAN:  1. Iron  deficiency anemia, unspecified iron  deficiency anemia type    #Iron  deficiency anemia,  Labs are reviewed and discussed with patient. Hemoglobin and iron  panel have both  improved.  No need for additional IV venofer .  She can not tolerate oral iron  supplementation.  Recommend patient to take iron  rich food.   Recommend patient to continue follow-up with primary care provider.  She does not need to actively follow-up with hematology.  She can reestablish care in the future if clinically indicated.  She agrees with the plan. All questions were answered. The patient knows to call the clinic with any problems questions or concerns.  cc Auston Reyes BIRCH, MD    Zelphia Cap, MD, PhD [facility] Hematology Oncology 05/24/2024

## 2024-05-24 NOTE — Assessment & Plan Note (Addendum)
 Lab Results  Component Value Date   HGB 6.7 (L) 05/15/2024   TIBC 528 (H) 05/15/2024   IRONPCTSAT 3 (L) 05/15/2024   FERRITIN 6 (L) 05/15/2024    Recommend IV venofer  weekly x 4 Refer to GI for evaluation.

## 2024-05-25 ENCOUNTER — Telehealth: Payer: Self-pay

## 2024-05-25 NOTE — Telephone Encounter (Signed)
 Referral faxed to [facility]. Re: IDA

## 2024-05-30 ENCOUNTER — Other Ambulatory Visit: Payer: Self-pay | Admitting: *Deleted

## 2024-05-30 DIAGNOSIS — D509 Iron deficiency anemia, unspecified: Secondary | ICD-10-CM

## 2024-05-31 ENCOUNTER — Inpatient Hospital Stay: Attending: Oncology

## 2024-05-31 DIAGNOSIS — D509 Iron deficiency anemia, unspecified: Secondary | ICD-10-CM | POA: Insufficient documentation

## 2024-05-31 DIAGNOSIS — Z79899 Other long term (current) drug therapy: Secondary | ICD-10-CM | POA: Insufficient documentation

## 2024-06-05 ENCOUNTER — Inpatient Hospital Stay

## 2024-06-07 ENCOUNTER — Telehealth: Payer: Self-pay

## 2024-06-07 DIAGNOSIS — D508 Other iron deficiency anemias: Secondary | ICD-10-CM | POA: Diagnosis not present

## 2024-06-07 NOTE — Telephone Encounter (Signed)
 I spoke with Alyssa Watson at [facility], she states Dr. Babara sent an urgent referral to them but they have tried all phone numbers, even emergency contact and can't get a call back to get sch'd. I told Alyssa Watson I would pass the info to the team.

## 2024-06-07 NOTE — Progress Notes (Signed)
 Alyssa Watson is a  68 y.o. female who presents for  CHIEF COMPLAINT Chief Complaint  Patient presents with  . Follow-up  . Anemia    Subjective: History of Present Illness  Pt in NAD. Here for f/u of IDA. Seeing Hematology now. Got IV iron  2 weeks ago. Has not seen GI. Weight and BP stable. Seeing Hematology again tomorrow. Denies CP or SOB. No palpitations. No syncope or near syncope.    Past Medical History:  Diagnosis Date  . Anxiety   . CAD (coronary artery disease)   . Carpal tunnel syndrome   . GERD (gastroesophageal reflux disease)   . Hyperlipidemia   . Insomnia   . Migraines    Patient Active Problem List  Diagnosis  . Coronary artery disease involving native coronary artery of native heart without angina pectoris  . Carotid stenosis, asymptomatic, bilateral  . Insomnia due to medical condition  . Pure hypercholesterolemia  . HTN, goal below 140/80  . Tobacco abuse  . GAD (generalized anxiety disorder)  . OSA (obstructive sleep apnea)  . Enterocele  . H/O elevated lipids  . Occlusion and stenosis of unspecified carotid artery  . Sleep disorder  . Syncope and collapse  . Patent foramen ovale (HHS-HCC)  . Bicuspid aortic valve  . Chronic tension-type headache, not intractable  . Aortic atherosclerosis ()  . Vitamin D deficiency  . Prediabetes  . Panlobular emphysema (CMS/HHS-HCC)  . B12 deficiency  . Iron  deficiency anemia secondary to inadequate dietary iron  intake    Past Surgical History:  Procedure Laterality Date  . CORONARY ANGIOPLASTY  09/14/2011  . COLONOSCOPY  01/22/2022   Tubular adenoma/SSP/Repeat 43yrs/CTL  . EGD  01/22/2022   Intestinal metaplasia/Repeat as needed/CTL  . ENDOSCOPIC CARPAL TUNNEL RELEASE    . HYSTERECTOMY       Current Outpatient Medications:  .  ALLERGY RELIEF, CETIRIZINE, 10 mg tablet, Take 1 tablet by mouth once daily, Disp: 90 tablet, Rfl: 0 .  ALPRAZolam  (XANAX ) 1 MG tablet, TAKE 1 TABLET BY MOUTH TWICE DAILY  AS NEEDED FOR SLEEP, Disp: 60 tablet, Rfl: 0 .  amLODIPine (NORVASC) 5 MG tablet, Take 1 tablet (5 mg total) by mouth once daily, Disp: 90 tablet, Rfl: 3 .  ARIPiprazole (ABILIFY) 5 MG tablet, Take 1 tablet (5 mg total) by mouth once daily for 30 days, Disp: 30 tablet, Rfl: 3 .  atorvastatin (LIPITOR) 80 MG tablet, Take 1 tablet (80 mg total) by mouth once daily, Disp: 90 tablet, Rfl: 3 .  butalbital-acetaminophen -caffeine (FIORICET) 50-325-40 mg tablet, TAKE 2 TABLETS BY MOUTH EVERY 6 HOURS AS NEEDED FOR PAIN, Disp: 60 tablet, Rfl: 0 .  clopidogreL  (PLAVIX ) 75 mg tablet, Take 1 tablet (75 mg total) by mouth once daily, Disp: 90 tablet, Rfl: 3 .  ergocalciferol, vitamin D2, 1,250 mcg (50,000 unit) capsule, Take 1 capsule by mouth once a week, Disp: 12 capsule, Rfl: 0 .  ezetimibe (ZETIA) 10 mg tablet, Take 1 tablet (10 mg total) by mouth once daily, Disp: 90 tablet, Rfl: 3 .  gabapentin (NEURONTIN) 300 MG capsule, Take 1 capsule (300 mg total) by mouth 2 (two) times daily, Disp: 180 capsule, Rfl: 1 .  nystatin (MYCOSTATIN) 100,000 unit/mL suspension, Swish and swallow 5 mLs 4 (four) times daily as needed For 10 days, Disp: 200 mL, Rfl: 3 .  pantoprazole (PROTONIX) 40 MG DR tablet, Take 1 tablet (40 mg total) by mouth 2 (two) times daily before meals, Disp: 180 tablet, Rfl: 3 .  propranoloL (INDERAL) 40 MG tablet, Take 1 tablet (40 mg total) by mouth 2 (two) times daily, Disp: 180 tablet, Rfl: 3 .  sertraline  (ZOLOFT ) 100 MG tablet, Take 1.5 tablets (150 mg total) by mouth once daily, Disp: 135 tablet, Rfl: 3  Astelin [azelastine], Codeine sulfate, Flonase [fluticasone], and Penicillin v potassium  Social History   Socioeconomic History  . Marital status: Married  Occupational History  . Occupation: Statistician- Part-time  Tobacco Use  . Smoking status: Every Day    Current packs/day: 0.50    Average packs/day: 0.5 packs/day for 33.0 years (16.5 ttl pk-yrs)    Types: Cigarettes  . Smokeless  tobacco: Never  Vaping Use  . Vaping status: Never Used  Substance and Sexual Activity  . Alcohol use: No  . Drug use: Defer  . Sexual activity: Yes    Partners: Male   Social Drivers of Health   Financial Resource Strain: Low Risk  (11/04/2023)   Overall Financial Resource Strain (CARDIA)   . Difficulty of Paying Living Expenses: Not hard at all  Food Insecurity: No Food Insecurity (11/04/2023)   Hunger Vital Sign   . Worried About Programme researcher, broadcasting/film/video in the Last Year: Never true   . Ran Out of Food in the Last Year: Never true  Transportation Needs: No Transportation Needs (11/04/2023)   PRAPARE - Transportation   . Lack of Transportation (Medical): No   . Lack of Transportation (Non-Medical): No  Housing Stability: Unknown (12/15/2023)   Housing Stability Vital Sign   . Unable to Pay for Housing in the Last Year: No   . Homeless in the Last Year: No    Family History  Problem Relation Name Age of Onset  . COPD Mother    . Coronary Artery Disease (Blocked arteries around heart) Mother    . No Known Problems Sister    . No Known Problems Brother      A comprehensive ROS was negative except for HPI  PE: BP 132/84   Pulse 77   Ht 152.4 cm (5')   Wt 52.2 kg (115 lb)   SpO2 97%   BMI 22.46 kg/m  The patient looks well today. She is in no distress. Neck is supple without adenopathy.  Carotids are 2+ bilaterally without bruits. Lungs are clear. Heart is regular rate and rhythm without murmurs, rubs, or gallops. Abdomen is soft, non tender, no masses or organomegaly. Lower extremities without obvious edema.   Nurse Only on 05/15/2024  Component Date Value Ref Range Status  . WBC (White Blood Cell Count) 05/15/2024 4.2  4.1 - 10.2 10^3/uL Final  . RBC (Red Blood Cell Count) 05/15/2024 3.46 (L)  4.04 - 5.48 10^6/uL Final  . Hemoglobin 05/15/2024 6.6 (L)  12.0 - 15.0 gm/dL Final  . Hematocrit 93/82/7974 24.0 (L)  35.0 - 47.0 % Final  . MCV (Mean Corpuscular Volume) 05/15/2024  69.4 (L)  80.0 - 100.0 fl Final  . MCH (Mean Corpuscular Hemoglobin) 05/15/2024 19.1 (L)  27.0 - 31.2 pg Final  . MCHC (Mean Corpuscular Hemoglobin * 05/15/2024 27.5 (L)  32.0 - 36.0 gm/dL Final  . Platelet Count 05/15/2024 232  150 - 450 10^3/uL Final  . RDW-CV (Red Cell Distribution Widt* 05/15/2024 17.8 (H)  11.6 - 14.8 % Final  . MPV (Mean Platelet Volume) 05/15/2024 9.9  9.4 - 12.4 fl Final  . Neutrophils 05/15/2024 2.29  1.50 - 7.80 10^3/uL Final  . Lymphocytes 05/15/2024 1.34  1.00 - 3.60 10^3/uL Final  .  Monocytes 05/15/2024 0.48  0.00 - 1.50 10^3/uL Final  . Eosinophils 05/15/2024 0.05  0.00 - 0.55 10^3/uL Final  . Basophils 05/15/2024 0.06  0.00 - 0.09 10^3/uL Final  . Neutrophil % 05/15/2024 54.2  32.0 - 70.0 % Final  . Lymphocyte % 05/15/2024 31.7  10.0 - 50.0 % Final  . Monocyte % 05/15/2024 11.3  4.0 - 13.0 % Final  . Eosinophil % 05/15/2024 1.2  1.0 - 5.0 % Final  . Basophil% 05/15/2024 1.4  0.0 - 2.0 % Final  . Immature Granulocyte % 05/15/2024 0.2  <=0.7 % Final  . Immature Granulocyte Count 05/15/2024 0.01  <=0.06 10^3/L Final  Office Visit on 05/11/2024  Component Date Value Ref Range Status  . WBC (White Blood Cell Count) 05/11/2024 4.4  4.1 - 10.2 10^3/uL Final  . RBC (Red Blood Cell Count) 05/11/2024 3.70 (L)  4.04 - 5.48 10^6/uL Final  . Hemoglobin 05/11/2024 6.9 (L)  12.0 - 15.0 gm/dL Final  . Hematocrit 93/86/7974 25.6 (L)  35.0 - 47.0 % Final  . MCV (Mean Corpuscular Volume) 05/11/2024 69.2 (L)  80.0 - 100.0 fl Final  . MCH (Mean Corpuscular Hemoglobin) 05/11/2024 18.6 (L)  27.0 - 31.2 pg Final  . MCHC (Mean Corpuscular Hemoglobin * 05/11/2024 27.0 (L)  32.0 - 36.0 gm/dL Final  . Platelet Count 05/11/2024 286  150 - 450 10^3/uL Final  . RDW-CV (Red Cell Distribution Widt* 05/11/2024 17.3 (H)  11.6 - 14.8 % Final  . MPV (Mean Platelet Volume) 05/11/2024 10.0  9.4 - 12.4 fl Final  . Neutrophils 05/11/2024 2.37  1.50 - 7.80 10^3/uL Final  . Lymphocytes  05/11/2024 1.28  1.00 - 3.60 10^3/uL Final  . Monocytes 05/11/2024 0.56  0.00 - 1.50 10^3/uL Final  . Eosinophils 05/11/2024 0.06  0.00 - 0.55 10^3/uL Final  . Basophils 05/11/2024 0.08  0.00 - 0.09 10^3/uL Final  . Neutrophil % 05/11/2024 54.4  32.0 - 70.0 % Final  . Lymphocyte % 05/11/2024 29.4  10.0 - 50.0 % Final  . Monocyte % 05/11/2024 12.8  4.0 - 13.0 % Final  . Eosinophil % 05/11/2024 1.4  1.0 - 5.0 % Final  . Basophil% 05/11/2024 1.8  0.0 - 2.0 % Final  . Immature Granulocyte % 05/11/2024 0.2  <=0.7 % Final  . Immature Granulocyte Count 05/11/2024 0.01  <=0.06 10^3/L Final  . Glucose 05/11/2024 91  70 - 110 mg/dL Final  . Sodium 93/86/7974 140  136 - 145 mmol/L Final  . Potassium 05/11/2024 3.6  3.6 - 5.1 mmol/L Final  . Chloride 05/11/2024 109  97 - 109 mmol/L Final  . Carbon Dioxide (CO2) 05/11/2024 27.7  22.0 - 32.0 mmol/L Final  . Urea Nitrogen (BUN) 05/11/2024 10  7 - 25 mg/dL Final  . Creatinine 93/86/7974 0.8  0.6 - 1.1 mg/dL Final  . Glomerular Filtration Rate (eGFR) 05/11/2024 80  >60 mL/min/1.73sq m Final  . Calcium  05/11/2024 9.2  8.7 - 10.3 mg/dL Final  . AST  93/86/7974 22  8 - 39 U/L Final  . ALT  05/11/2024 8  5 - 38 U/L Final  . Alk Phos (alkaline Phosphatase) 05/11/2024 68  34 - 104 U/L Final  . Albumin 05/11/2024 3.9  3.5 - 4.8 g/dL Final  . Bilirubin, Total 05/11/2024 0.3  0.3 - 1.2 mg/dL Final  . Protein, Total 05/11/2024 6.7  6.1 - 7.9 g/dL Final  . A/G Ratio 93/86/7974 1.4  1.0 - 5.0 gm/dL Final  . Cholesterol, Total 05/11/2024 140  100 - 200 mg/dL Final  . Triglyceride 93/86/7974 55  35 - 199 mg/dL Final  . HDL (High Density Lipoprotein) Cho* 05/11/2024 46.8  35.0 - 85.0 mg/dL Final  . LDL Calculated 05/11/2024 82  0 - 130 mg/dL Final  . VLDL Cholesterol 05/11/2024 11  mg/dL Final  . Cholesterol/HDL Ratio 05/11/2024 3.0   Final  . Hemoglobin A1C 05/11/2024 5.9 (H)  4.2 - 5.6 % Final  . Average Blood Glucose (Calc) 05/11/2024 123  mg/dL Final  .  Color 93/86/7974 Yellow  Colorless, Straw, Light Yellow, Yellow, Dark Yellow Final  . Clarity 05/11/2024 Clear  Clear Final  . Specific Gravity 05/11/2024 1.029  1.005 - 1.030 Final  . pH, Urine 05/11/2024 6.0  5.0 - 8.0 Final  . Protein, Urinalysis 05/11/2024 Trace (!)  Negative mg/dL Final  . Glucose, Urinalysis 05/11/2024 Negative  Negative mg/dL Final  . Ketones, Urinalysis 05/11/2024 Negative  Negative mg/dL Final  . Blood, Urinalysis 05/11/2024 Trace (!)  Negative Final  . Nitrite, Urinalysis 05/11/2024 Negative  Negative Final  . Leukocyte Esterase, Urinalysis 05/11/2024 Trace (!)  Negative Final  . Bilirubin, Urinalysis 05/11/2024 Negative  Negative Final  . Urobilinogen, Urinalysis 05/11/2024 3.0 (H)  0.2 - 1.0 mg/dL Final  . WBC, UA 93/86/7974 2  <=5 /hpf Final  . Red Blood Cells, Urinalysis 05/11/2024 4 (H)  <=3 /hpf Final  . Bacteria, Urinalysis 05/11/2024 0-5  0 - 5 /hpf Final  . Squamous Epithelial Cells, Urinaly* 05/11/2024 4  /hpf Final  Ancillary Orders on 02/14/2024  Component Date Value Ref Range Status  . WBC (White Blood Cell Count) 02/14/2024 7.1  4.1 - 10.2 10^3/uL Final  . RBC (Red Blood Cell Count) 02/14/2024 3.92 (L)  4.04 - 5.48 10^6/uL Final  . Hemoglobin 02/14/2024 9.0 (L)  12.0 - 15.0 gm/dL Final  . Hematocrit 96/81/7974 30.9 (L)  35.0 - 47.0 % Final  . MCV (Mean Corpuscular Volume) 02/14/2024 78.8 (L)  80.0 - 100.0 fl Final  . MCH (Mean Corpuscular Hemoglobin) 02/14/2024 23.0 (L)  27.0 - 31.2 pg Final  . MCHC (Mean Corpuscular Hemoglobin * 02/14/2024 29.1 (L)  32.0 - 36.0 gm/dL Final  . Platelet Count 02/14/2024 289  150 - 450 10^3/uL Final  . RDW-CV (Red Cell Distribution Widt* 02/14/2024 15.9 (H)  11.6 - 14.8 % Final  . MPV (Mean Platelet Volume) 02/14/2024 10.6  9.4 - 12.4 fl Final  . Neutrophils 02/14/2024 4.89  1.50 - 7.80 10^3/uL Final  . Lymphocytes 02/14/2024 1.16  1.00 - 3.60 10^3/uL Final  . Monocytes 02/14/2024 0.66  0.00 - 1.50 10^3/uL Final   . Eosinophils 02/14/2024 0.24  0.00 - 0.55 10^3/uL Final  . Basophils 02/14/2024 0.08  0.00 - 0.09 10^3/uL Final  . Neutrophil % 02/14/2024 69.4  32.0 - 70.0 % Final  . Lymphocyte % 02/14/2024 16.4  10.0 - 50.0 % Final  . Monocyte % 02/14/2024 9.3  4.0 - 13.0 % Final  . Eosinophil % 02/14/2024 3.4  1.0 - 5.0 % Final  . Basophil% 02/14/2024 1.1  0.0 - 2.0 % Final  . Immature Granulocyte % 02/14/2024 0.4  <=0.7 % Final  . Immature Granulocyte Count 02/14/2024 0.03  <=0.06 10^3/L Final  . Iron  02/14/2024 18 (L)  28 - 170 ug/dL Final  . Total Iron  Binding Capacity (TIBC) 02/14/2024 416.8  261.0 - 478.0 ug/dL Final  . Transferrin 96/81/7974 297.7  203.0 - 362.0 mg/dL Final  . % Saturation 96/81/7974 4  % Final  .  Vitamin B12 02/14/2024 493  >300 pg/mL Final  . Ferritin 02/14/2024 11  11 - 307 ng/mL Final  Office Visit on 02/03/2024  Component Date Value Ref Range Status  . WBC (White Blood Cell Count) 02/03/2024 6.7  4.1 - 10.2 10^3/uL Final  . RBC (Red Blood Cell Count) 02/03/2024 3.69 (L)  4.04 - 5.48 10^6/uL Final  . Hemoglobin 02/03/2024 8.6 (L)  12.0 - 15.0 gm/dL Final  . Hematocrit 96/92/7974 29.6 (L)  35.0 - 47.0 % Final  . MCV (Mean Corpuscular Volume) 02/03/2024 80.2  80.0 - 100.0 fl Final  . MCH (Mean Corpuscular Hemoglobin) 02/03/2024 23.3 (L)  27.0 - 31.2 pg Final  . MCHC (Mean Corpuscular Hemoglobin * 02/03/2024 29.1 (L)  32.0 - 36.0 gm/dL Final  . Platelet Count 02/03/2024 274  150 - 450 10^3/uL Final  . RDW-CV (Red Cell Distribution Widt* 02/03/2024 15.1 (H)  11.6 - 14.8 % Final  . MPV (Mean Platelet Volume) 02/03/2024 11.1  9.4 - 12.4 fl Final  . Neutrophils 02/03/2024 4.97  1.50 - 7.80 10^3/uL Final  . Lymphocytes 02/03/2024 1.02  1.00 - 3.60 10^3/uL Final  . Monocytes 02/03/2024 0.52  0.00 - 1.50 10^3/uL Final  . Eosinophils 02/03/2024 0.15  0.00 - 0.55 10^3/uL Final  . Basophils 02/03/2024 0.05  0.00 - 0.09 10^3/uL Final  . Neutrophil % 02/03/2024 74.1 (H)  32.0 -  70.0 % Final  . Lymphocyte % 02/03/2024 15.2  10.0 - 50.0 % Final  . Monocyte % 02/03/2024 7.7  4.0 - 13.0 % Final  . Eosinophil % 02/03/2024 2.2  1.0 - 5.0 % Final  . Basophil% 02/03/2024 0.7  0.0 - 2.0 % Final  . Immature Granulocyte % 02/03/2024 0.1  <=0.7 % Final  . Immature Granulocyte Count 02/03/2024 0.01  <=0.06 10^3/L Final  . Glucose 02/03/2024 77  70 - 110 mg/dL Final  . Sodium 96/92/7974 140  136 - 145 mmol/L Final  . Potassium 02/03/2024 3.9  3.6 - 5.1 mmol/L Final  . Chloride 02/03/2024 107  97 - 109 mmol/L Final  . Carbon Dioxide (CO2) 02/03/2024 27.9  22.0 - 32.0 mmol/L Final  . Urea Nitrogen (BUN) 02/03/2024 9  7 - 25 mg/dL Final  . Creatinine 96/92/7974 0.8  0.6 - 1.1 mg/dL Final  . Glomerular Filtration Rate (eGFR) 02/03/2024 80  >60 mL/min/1.73sq m Final  . Calcium  02/03/2024 9.1  8.7 - 10.3 mg/dL Final  . AST  96/92/7974 23  8 - 39 U/L Final  . ALT  02/03/2024 8  5 - 38 U/L Final  . Alk Phos (alkaline Phosphatase) 02/03/2024 74  34 - 104 U/L Final  . Albumin 02/03/2024 3.8  3.5 - 4.8 g/dL Final  . Bilirubin, Total 02/03/2024 0.2 (L)  0.3 - 1.2 mg/dL Final  . Protein, Total 02/03/2024 6.5  6.1 - 7.9 g/dL Final  . A/G Ratio 96/92/7974 1.4  1.0 - 5.0 gm/dL Final  . Hemoglobin J8R 02/03/2024 6.1 (H)  4.2 - 5.6 % Final  . Average Blood Glucose (Calc) 02/03/2024 128  mg/dL Final  Office Visit on 01/17/2024  Component Date Value Ref Range Status  . Influenza A PCR 01/17/2024 Negative  Negative Final  . Influenza B PCR 01/17/2024 Negative  Negative Final  . RSV PCR 01/17/2024 Negative  Negative Final  . SARS-CoV2 PCR 01/17/2024 Negative  Negative Final  Office Visit on 11/04/2023  Component Date Value Ref Range Status  . WBC (White Blood Cell Count) 11/04/2023 5.2  4.1 -  10.2 10^3/uL Final  . RBC (Red Blood Cell Count) 11/04/2023 4.25  4.04 - 5.48 10^6/uL Final  . Hemoglobin 11/04/2023 11.1 (L)  12.0 - 15.0 gm/dL Final  . Hematocrit 87/93/7975 35.0  35.0 - 47.0 %  Final  . MCV (Mean Corpuscular Volume) 11/04/2023 82.4  80.0 - 100.0 fl Final  . MCH (Mean Corpuscular Hemoglobin) 11/04/2023 26.1 (L)  27.0 - 31.2 pg Final  . MCHC (Mean Corpuscular Hemoglobin * 11/04/2023 31.7 (L)  32.0 - 36.0 gm/dL Final  . Platelet Count 11/04/2023 259  150 - 450 10^3/uL Final  . RDW-CV (Red Cell Distribution Widt* 11/04/2023 15.5 (H)  11.6 - 14.8 % Final  . MPV (Mean Platelet Volume) 11/04/2023 10.9  9.4 - 12.4 fl Final  . Neutrophils 11/04/2023 3.23  1.50 - 7.80 10^3/uL Final  . Lymphocytes 11/04/2023 1.24  1.00 - 3.60 10^3/uL Final  . Monocytes 11/04/2023 0.53  0.00 - 1.50 10^3/uL Final  . Eosinophils 11/04/2023 0.17  0.00 - 0.55 10^3/uL Final  . Basophils 11/04/2023 0.06  0.00 - 0.09 10^3/uL Final  . Neutrophil % 11/04/2023 61.7  32.0 - 70.0 % Final  . Lymphocyte % 11/04/2023 23.7  10.0 - 50.0 % Final  . Monocyte % 11/04/2023 10.1  4.0 - 13.0 % Final  . Eosinophil % 11/04/2023 3.2  1.0 - 5.0 % Final  . Basophil% 11/04/2023 1.1  0.0 - 2.0 % Final  . Immature Granulocyte % 11/04/2023 0.2  <=0.7 % Final  . Immature Granulocyte Count 11/04/2023 0.01  <=0.06 10^3/L Final  . Glucose 11/04/2023 76  70 - 110 mg/dL Final  . Sodium 87/93/7975 139  136 - 145 mmol/L Final  . Potassium 11/04/2023 4.2  3.6 - 5.1 mmol/L Final  . Chloride 11/04/2023 105  97 - 109 mmol/L Final  . Carbon Dioxide (CO2) 11/04/2023 29.7  22.0 - 32.0 mmol/L Final  . Urea Nitrogen (BUN) 11/04/2023 8  7 - 25 mg/dL Final  . Creatinine 87/93/7975 0.8  0.6 - 1.1 mg/dL Final  . Glomerular Filtration Rate (eGFR) 11/04/2023 80  >60 mL/min/1.73sq m Final  . Calcium  11/04/2023 9.6  8.7 - 10.3 mg/dL Final  . AST  87/93/7975 22  8 - 39 U/L Final  . ALT  11/04/2023 9  5 - 38 U/L Final  . Alk Phos (alkaline Phosphatase) 11/04/2023 77  34 - 104 U/L Final  . Albumin 11/04/2023 3.9  3.5 - 4.8 g/dL Final  . Bilirubin, Total 11/04/2023 0.2 (L)  0.3 - 1.2 mg/dL Final  . Protein, Total 11/04/2023 6.4  6.1 - 7.9 g/dL  Final  . A/G Ratio 87/93/7975 1.6  1.0 - 5.0 gm/dL Final  . Cholesterol, Total 11/04/2023 172  100 - 200 mg/dL Final  . Triglyceride 87/93/7975 104  35 - 199 mg/dL Final  . HDL (High Density Lipoprotein) Cho* 11/04/2023 41.5  35.0 - 85.0 mg/dL Final  . LDL Calculated 11/04/2023 889  0 - 130 mg/dL Final  . VLDL Cholesterol 11/04/2023 21  mg/dL Final  . Cholesterol/HDL Ratio 11/04/2023 4.1   Final  . Thyroid  Stimulating Hormone (TSH) 11/04/2023 1.011  0.450-5.330 uIU/ml uIU/mL Final  . Color 11/04/2023 Light Yellow  Colorless, Straw, Light Yellow, Yellow, Dark Yellow Final  . Clarity 11/04/2023 Clear  Clear Final  . Specific Gravity 11/04/2023 1.014  1.005 - 1.030 Final  . pH, Urine 11/04/2023 6.5  5.0 - 8.0 Final  . Protein, Urinalysis 11/04/2023 Negative  Negative mg/dL Final  . Glucose, Urinalysis 11/04/2023 Negative  Negative mg/dL Final  . Ketones, Urinalysis 11/04/2023 Negative  Negative mg/dL Final  . Blood, Urinalysis 11/04/2023 Negative  Negative Final  . Nitrite, Urinalysis 11/04/2023 Negative  Negative Final  . Leukocyte Esterase, Urinalysis 11/04/2023 Negative  Negative Final  . Bilirubin, Urinalysis 11/04/2023 Negative  Negative Final  . Urobilinogen, Urinalysis 11/04/2023 0.2  0.2 - 1.0 mg/dL Final  . WBC, UA 87/93/7975 1  <=5 /hpf Final  . Red Blood Cells, Urinalysis 11/04/2023 <1  <=3 /hpf Final  . Bacteria, Urinalysis 11/04/2023 0-5  0 - 5 /hpf Final  . Squamous Epithelial Cells, Urinaly* 11/04/2023 0  /hpf Final  . Vitamin B12 11/04/2023 175 (L)  >300 pg/mL Final  . Ferritin 11/04/2023 9 (L)  11 - 307 ng/mL Final  . Hemoglobin A1C 11/04/2023 6.1 (H)  4.2 - 5.6 % Final  . Average Blood Glucose (Calc) 11/04/2023 128  mg/dL Final  Appointment on 91/87/7975  Component Date Value Ref Range Status  . WBC (White Blood Cell Count) 07/11/2023 6.0  4.1 - 10.2 10^3/uL Final  . RBC (Red Blood Cell Count) 07/11/2023 3.74 (L)  4.04 - 5.48 10^6/uL Final  . Hemoglobin 07/11/2023 10.6  (L)  12.0 - 15.0 gm/dL Final  . Hematocrit 91/87/7975 33.1 (L)  35.0 - 47.0 % Final  . MCV (Mean Corpuscular Volume) 07/11/2023 88.5  80.0 - 100.0 fl Final  . MCH (Mean Corpuscular Hemoglobin) 07/11/2023 28.3  27.0 - 31.2 pg Final  . MCHC (Mean Corpuscular Hemoglobin * 07/11/2023 32.0  32.0 - 36.0 gm/dL Final  . Platelet Count 07/11/2023 207  150 - 450 10^3/uL Final  . RDW-CV (Red Cell Distribution Widt* 07/11/2023 13.8  11.6 - 14.8 % Final  . MPV (Mean Platelet Volume) 07/11/2023 11.4  9.4 - 12.4 fl Final  . Neutrophils 07/11/2023 4.21  1.50 - 7.80 10^3/uL Final  . Lymphocytes 07/11/2023 1.11  1.00 - 3.60 10^3/uL Final  . Monocytes 07/11/2023 0.49  0.00 - 1.50 10^3/uL Final  . Eosinophils 07/11/2023 0.08  0.00 - 0.55 10^3/uL Final  . Basophils 07/11/2023 0.05  0.00 - 0.09 10^3/uL Final  . Neutrophil % 07/11/2023 70.8 (H)  32.0 - 70.0 % Final  . Lymphocyte % 07/11/2023 18.7  10.0 - 50.0 % Final  . Monocyte % 07/11/2023 8.2  4.0 - 13.0 % Final  . Eosinophil % 07/11/2023 1.3  1.0 - 5.0 % Final  . Basophil% 07/11/2023 0.8  0.0 - 2.0 % Final  . Immature Granulocyte % 07/11/2023 0.2  <=0.7 % Final  . Immature Granulocyte Count 07/11/2023 0.01  <=0.06 10^3/L Final  . Cholesterol, Total 07/11/2023 177  100 - 200 mg/dL Final  . Triglyceride 91/87/7975 71  35 - 199 mg/dL Final  . HDL (High Density Lipoprotein) Cho* 07/11/2023 50.3  35.0 - 85.0 mg/dL Final  . LDL Calculated 07/11/2023 886  0 - 130 mg/dL Final  . VLDL Cholesterol 07/11/2023 14  mg/dL Final  . Cholesterol/HDL Ratio 07/11/2023 3.5   Final  . Glucose 07/11/2023 82  70 - 110 mg/dL Final  . Sodium 91/87/7975 141  136 - 145 mmol/L Final  . Potassium 07/11/2023 3.9  3.6 - 5.1 mmol/L Final  . Chloride 07/11/2023 107  97 - 109 mmol/L Final  . Carbon Dioxide (CO2) 07/11/2023 28.2  22.0 - 32.0 mmol/L Final  . Urea Nitrogen (BUN) 07/11/2023 9  7 - 25 mg/dL Final  . Creatinine 91/87/7975 0.7  0.6 - 1.1 mg/dL Final  . Glomerular Filtration  Rate (eGFR) 07/11/2023 94  >  60 mL/min/1.73sq m Final  . Calcium  07/11/2023 9.0  8.7 - 10.3 mg/dL Final  . AST  91/87/7975 18  8 - 39 U/L Final  . ALT  07/11/2023 7  5 - 38 U/L Final  . Alk Phos (alkaline Phosphatase) 07/11/2023 72  34 - 104 U/L Final  . Albumin 07/11/2023 3.8  3.5 - 4.8 g/dL Final  . Bilirubin, Total 07/11/2023 0.2 (L)  0.3 - 1.2 mg/dL Final  . Protein, Total 07/11/2023 6.1  6.1 - 7.9 g/dL Final  . A/G Ratio 91/87/7975 1.7  1.0 - 5.0 gm/dL Final  . Thyroid  Stimulating Hormone (TSH) 07/11/2023 0.817  0.450-5.330 uIU/ml uIU/mL Final  . Color 07/11/2023 Colorless  Colorless, Straw, Light Yellow, Yellow, Dark Yellow Final  . Clarity 07/11/2023 Clear  Clear Final  . Specific Gravity 07/11/2023 1.005  1.005 - 1.030 Final  . pH, Urine 07/11/2023 7.0  5.0 - 8.0 Final  . Protein, Urinalysis 07/11/2023 Negative  Negative mg/dL Final  . Glucose, Urinalysis 07/11/2023 Negative  Negative mg/dL Final  . Ketones, Urinalysis 07/11/2023 Negative  Negative mg/dL Final  . Blood, Urinalysis 07/11/2023 Negative  Negative Final  . Nitrite, Urinalysis 07/11/2023 Negative  Negative Final  . Leukocyte Esterase, Urinalysis 07/11/2023 Negative  Negative Final  . Bilirubin, Urinalysis 07/11/2023 Negative  Negative Final  . Urobilinogen, Urinalysis 07/11/2023 0.2  0.2 - 1.0 mg/dL Final  . WBC, UA 91/87/7975 1  <=5 /hpf Final  . Red Blood Cells, Urinalysis 07/11/2023 1  <=3 /hpf Final  . Bacteria, Urinalysis 07/11/2023 0-5  0 - 5 /hpf Final  . Squamous Epithelial Cells, Urinaly* 07/11/2023 0  /hpf Final  . Hemoglobin A1C 07/11/2023 5.8 (H)  4.2 - 5.6 % Final  . Average Blood Glucose (Calc) 07/11/2023 120  mg/dL Final   DIAGNOSIS: Iron  deficiency anemia secondary to inadequate dietary iron  intake  (primary encounter diagnosis)   PLAN: No cahnge in care. Refer to GI. F/u with Hematology. RTC 3 mo, sooner if needed     Attestation Statement:   I personally performed the service.  (TP)  Reyes JONETTA Costa, MD, MD

## 2024-06-08 ENCOUNTER — Inpatient Hospital Stay

## 2024-06-08 VITALS — BP 155/64 | HR 70 | Temp 97.2°F | Resp 18

## 2024-06-08 DIAGNOSIS — D509 Iron deficiency anemia, unspecified: Secondary | ICD-10-CM | POA: Diagnosis not present

## 2024-06-08 DIAGNOSIS — Z79899 Other long term (current) drug therapy: Secondary | ICD-10-CM | POA: Diagnosis not present

## 2024-06-08 MED ORDER — IRON SUCROSE 20 MG/ML IV SOLN
200.0000 mg | Freq: Once | INTRAVENOUS | Status: AC
  Administered 2024-06-08: 200 mg via INTRAVENOUS
  Filled 2024-06-08: qty 10

## 2024-06-08 MED ORDER — SODIUM CHLORIDE 0.9% FLUSH
10.0000 mL | Freq: Once | INTRAVENOUS | Status: AC | PRN
  Administered 2024-06-08: 10 mL
  Filled 2024-06-08: qty 10

## 2024-06-08 NOTE — Telephone Encounter (Signed)
 Pt came to clinic today for iron  infusion. Talked to her and gave her to the number to [facility], asked her to call to make appt. Pt verbalized understanding.

## 2024-06-12 ENCOUNTER — Inpatient Hospital Stay

## 2024-06-12 VITALS — BP 128/60 | HR 66 | Temp 96.3°F | Resp 18

## 2024-06-12 DIAGNOSIS — D509 Iron deficiency anemia, unspecified: Secondary | ICD-10-CM

## 2024-06-12 DIAGNOSIS — Z79899 Other long term (current) drug therapy: Secondary | ICD-10-CM | POA: Diagnosis not present

## 2024-06-12 MED ORDER — IRON SUCROSE 20 MG/ML IV SOLN
200.0000 mg | Freq: Once | INTRAVENOUS | Status: AC
  Administered 2024-06-12: 200 mg via INTRAVENOUS
  Filled 2024-06-12: qty 10

## 2024-06-12 NOTE — Patient Instructions (Signed)

## 2024-06-15 DIAGNOSIS — E538 Deficiency of other specified B group vitamins: Secondary | ICD-10-CM | POA: Diagnosis not present

## 2024-06-15 DIAGNOSIS — R7989 Other specified abnormal findings of blood chemistry: Secondary | ICD-10-CM | POA: Diagnosis not present

## 2024-06-19 ENCOUNTER — Inpatient Hospital Stay

## 2024-06-19 VITALS — BP 117/59 | HR 63 | Temp 97.0°F | Resp 18

## 2024-06-19 DIAGNOSIS — Z79899 Other long term (current) drug therapy: Secondary | ICD-10-CM | POA: Diagnosis not present

## 2024-06-19 DIAGNOSIS — D509 Iron deficiency anemia, unspecified: Secondary | ICD-10-CM

## 2024-06-19 MED ORDER — IRON SUCROSE 20 MG/ML IV SOLN
200.0000 mg | Freq: Once | INTRAVENOUS | Status: AC
  Administered 2024-06-19: 200 mg via INTRAVENOUS
  Filled 2024-06-19: qty 10

## 2024-06-19 MED ORDER — SODIUM CHLORIDE 0.9% FLUSH
10.0000 mL | Freq: Once | INTRAVENOUS | Status: AC | PRN
  Administered 2024-06-19: 10 mL
  Filled 2024-06-19: qty 10

## 2024-06-25 ENCOUNTER — Telehealth: Payer: Self-pay | Admitting: *Deleted

## 2024-06-25 NOTE — Telephone Encounter (Signed)
 I looked into the appts and she already got the 3 she needed and the next appt for the patient is 9/26/ at 10:15.  She just did not want to make a mistake and thanked me for straighten it all out.

## 2024-08-07 DIAGNOSIS — L570 Actinic keratosis: Secondary | ICD-10-CM | POA: Diagnosis not present

## 2024-08-07 DIAGNOSIS — L821 Other seborrheic keratosis: Secondary | ICD-10-CM | POA: Diagnosis not present

## 2024-08-07 DIAGNOSIS — L720 Epidermal cyst: Secondary | ICD-10-CM | POA: Diagnosis not present

## 2024-08-24 ENCOUNTER — Inpatient Hospital Stay: Attending: Oncology

## 2024-08-27 ENCOUNTER — Inpatient Hospital Stay: Admitting: Oncology

## 2024-08-27 ENCOUNTER — Inpatient Hospital Stay

## 2024-08-29 ENCOUNTER — Inpatient Hospital Stay

## 2024-08-30 ENCOUNTER — Inpatient Hospital Stay

## 2024-08-30 ENCOUNTER — Inpatient Hospital Stay: Admitting: Oncology

## 2024-09-25 DIAGNOSIS — F411 Generalized anxiety disorder: Secondary | ICD-10-CM | POA: Diagnosis not present

## 2024-09-25 DIAGNOSIS — Z79899 Other long term (current) drug therapy: Secondary | ICD-10-CM | POA: Diagnosis not present

## 2024-09-25 DIAGNOSIS — Z0000 Encounter for general adult medical examination without abnormal findings: Secondary | ICD-10-CM | POA: Diagnosis not present

## 2024-09-25 DIAGNOSIS — Z720 Tobacco use: Secondary | ICD-10-CM | POA: Diagnosis not present

## 2024-09-25 DIAGNOSIS — I10 Essential (primary) hypertension: Secondary | ICD-10-CM | POA: Diagnosis not present

## 2024-09-25 DIAGNOSIS — R7303 Prediabetes: Secondary | ICD-10-CM | POA: Diagnosis not present

## 2024-09-25 DIAGNOSIS — E7800 Pure hypercholesterolemia, unspecified: Secondary | ICD-10-CM | POA: Diagnosis not present

## 2024-09-25 DIAGNOSIS — E538 Deficiency of other specified B group vitamins: Secondary | ICD-10-CM | POA: Diagnosis not present

## 2024-09-25 DIAGNOSIS — Z1331 Encounter for screening for depression: Secondary | ICD-10-CM | POA: Diagnosis not present

## 2024-09-25 DIAGNOSIS — J431 Panlobular emphysema: Secondary | ICD-10-CM | POA: Diagnosis not present

## 2024-10-04 ENCOUNTER — Telehealth: Payer: Self-pay | Admitting: Oncology

## 2024-10-04 NOTE — Telephone Encounter (Signed)
 Please postpone appts until after thanksgiving. R/s with labs prior like original appt.

## 2024-10-04 NOTE — Telephone Encounter (Signed)
 Pt called and left a vm stating she woke up this morning with a sore throat and runny nose and wont be able to make her lab appt tomorrow. Pt also has upcoming appts on 11/10 and I wasn't sure if those needed to be rescheduled too?  Please advise.

## 2024-10-05 ENCOUNTER — Inpatient Hospital Stay

## 2024-10-08 ENCOUNTER — Inpatient Hospital Stay

## 2024-10-08 ENCOUNTER — Inpatient Hospital Stay: Admitting: Oncology

## 2024-11-19 ENCOUNTER — Telehealth: Payer: Self-pay | Admitting: Oncology

## 2024-11-19 ENCOUNTER — Inpatient Hospital Stay

## 2024-11-19 NOTE — Telephone Encounter (Signed)
 Patient called to reschedule appointments to after the new year. She said her husband has been in the hospital and she forgot labs today but, Dr. Auston just did labs and said everything was fine. Appointments rescheduled as requested

## 2024-11-20 ENCOUNTER — Inpatient Hospital Stay

## 2024-11-20 ENCOUNTER — Inpatient Hospital Stay: Admitting: Oncology

## 2025-01-03 ENCOUNTER — Inpatient Hospital Stay

## 2025-01-04 ENCOUNTER — Inpatient Hospital Stay

## 2025-01-04 DIAGNOSIS — D509 Iron deficiency anemia, unspecified: Secondary | ICD-10-CM

## 2025-01-04 LAB — CBC WITH DIFFERENTIAL (CANCER CENTER ONLY)
Abs Immature Granulocytes: 0.01 10*3/uL (ref 0.00–0.07)
Basophils Absolute: 0 10*3/uL (ref 0.0–0.1)
Basophils Relative: 1 %
Eosinophils Absolute: 0.1 10*3/uL (ref 0.0–0.5)
Eosinophils Relative: 1 %
HCT: 37.3 % (ref 36.0–46.0)
Hemoglobin: 12.5 g/dL (ref 12.0–15.0)
Immature Granulocytes: 0 %
Lymphocytes Relative: 22 %
Lymphs Abs: 1 10*3/uL (ref 0.7–4.0)
MCH: 31.6 pg (ref 26.0–34.0)
MCHC: 33.5 g/dL (ref 30.0–36.0)
MCV: 94.4 fL (ref 80.0–100.0)
Monocytes Absolute: 0.4 10*3/uL (ref 0.1–1.0)
Monocytes Relative: 9 %
Neutro Abs: 3 10*3/uL (ref 1.7–7.7)
Neutrophils Relative %: 67 %
Platelet Count: 169 10*3/uL (ref 150–400)
RBC: 3.95 MIL/uL (ref 3.87–5.11)
RDW: 12.8 % (ref 11.5–15.5)
WBC Count: 4.5 10*3/uL (ref 4.0–10.5)
nRBC: 0 % (ref 0.0–0.2)

## 2025-01-04 LAB — IRON AND TIBC
Iron: 131 ug/dL (ref 28–170)
Saturation Ratios: 37 % — ABNORMAL HIGH (ref 10.4–31.8)
TIBC: 356 ug/dL (ref 250–450)
UIBC: 225 ug/dL

## 2025-01-04 LAB — SAMPLE TO BLOOD BANK

## 2025-01-04 LAB — FERRITIN: Ferritin: 46 ng/mL (ref 11–307)

## 2025-01-07 ENCOUNTER — Inpatient Hospital Stay

## 2025-01-07 ENCOUNTER — Inpatient Hospital Stay: Admitting: Oncology
